# Patient Record
Sex: Female | Born: 1990 | Race: White | Hispanic: No | Marital: Single | Smoking: Former smoker
Health system: Southern US, Community
[De-identification: ages and names within clinical notes are randomized; demographics above are authoritative.]

## PROBLEM LIST (undated history)

## (undated) VITALS — BP 119/71 | HR 111 | Temp 98.6°F | Resp 20 | Ht 66.0 in

## (undated) DIAGNOSIS — T50901A Poisoning by unspecified drugs, medicaments and biological substances, accidental (unintentional), initial encounter: Secondary | ICD-10-CM

## (undated) DIAGNOSIS — F319 Bipolar disorder, unspecified: Secondary | ICD-10-CM

## (undated) DIAGNOSIS — F909 Attention-deficit hyperactivity disorder, unspecified type: Secondary | ICD-10-CM

## (undated) DIAGNOSIS — N83209 Unspecified ovarian cyst, unspecified side: Secondary | ICD-10-CM

## (undated) DIAGNOSIS — G8929 Other chronic pain: Secondary | ICD-10-CM

## (undated) DIAGNOSIS — E041 Nontoxic single thyroid nodule: Secondary | ICD-10-CM

## (undated) DIAGNOSIS — R87619 Unspecified abnormal cytological findings in specimens from cervix uteri: Secondary | ICD-10-CM

## (undated) DIAGNOSIS — F1910 Other psychoactive substance abuse, uncomplicated: Secondary | ICD-10-CM

## (undated) DIAGNOSIS — J45909 Unspecified asthma, uncomplicated: Secondary | ICD-10-CM

## (undated) DIAGNOSIS — M549 Dorsalgia, unspecified: Secondary | ICD-10-CM

## (undated) HISTORY — DX: Nontoxic single thyroid nodule: E04.1

## (undated) HISTORY — DX: Unspecified ovarian cyst, unspecified side: N83.209

## (undated) HISTORY — PX: MOUTH SURGERY: SHX715

## (undated) HISTORY — PX: HAND SURGERY: SHX662

## (undated) HISTORY — DX: Unspecified abnormal cytological findings in specimens from cervix uteri: R87.619

---
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
## 2008-02-11 ENCOUNTER — Other Ambulatory Visit: Admission: RE | Admit: 2008-02-11 | Discharge: 2008-02-11 | Payer: Self-pay | Admitting: Obstetrics and Gynecology

## 2008-05-02 ENCOUNTER — Emergency Department (HOSPITAL_COMMUNITY): Admission: EM | Admit: 2008-05-02 | Discharge: 2008-05-02 | Payer: Self-pay | Admitting: Emergency Medicine

## 2008-07-30 ENCOUNTER — Emergency Department (HOSPITAL_COMMUNITY): Admission: EM | Admit: 2008-07-30 | Discharge: 2008-07-30 | Payer: Self-pay | Admitting: Emergency Medicine

## 2008-11-13 ENCOUNTER — Emergency Department (HOSPITAL_COMMUNITY): Admission: EM | Admit: 2008-11-13 | Discharge: 2008-11-13 | Payer: Self-pay | Admitting: Emergency Medicine

## 2009-04-08 ENCOUNTER — Emergency Department (HOSPITAL_COMMUNITY): Admission: EM | Admit: 2009-04-08 | Discharge: 2009-04-08 | Payer: Self-pay | Admitting: Emergency Medicine

## 2009-06-26 ENCOUNTER — Emergency Department (HOSPITAL_COMMUNITY): Admission: EM | Admit: 2009-06-26 | Discharge: 2009-06-26 | Payer: Self-pay | Admitting: Emergency Medicine

## 2009-07-19 ENCOUNTER — Emergency Department (HOSPITAL_COMMUNITY): Admission: EM | Admit: 2009-07-19 | Discharge: 2009-07-19 | Payer: Self-pay | Admitting: Emergency Medicine

## 2009-07-23 ENCOUNTER — Emergency Department (HOSPITAL_COMMUNITY): Admission: EM | Admit: 2009-07-23 | Discharge: 2009-07-23 | Payer: Self-pay | Admitting: Emergency Medicine

## 2010-01-14 ENCOUNTER — Emergency Department (HOSPITAL_COMMUNITY): Admission: EM | Admit: 2010-01-14 | Discharge: 2010-01-14 | Payer: Self-pay | Admitting: Emergency Medicine

## 2010-02-01 ENCOUNTER — Emergency Department (HOSPITAL_COMMUNITY): Admission: EM | Admit: 2010-02-01 | Discharge: 2010-02-02 | Payer: Self-pay | Admitting: Emergency Medicine

## 2010-04-19 ENCOUNTER — Emergency Department (HOSPITAL_COMMUNITY): Admission: EM | Admit: 2010-04-19 | Discharge: 2010-04-20 | Payer: Self-pay | Admitting: Emergency Medicine

## 2010-05-27 ENCOUNTER — Emergency Department (HOSPITAL_COMMUNITY): Admission: EM | Admit: 2010-05-27 | Discharge: 2010-05-27 | Payer: Self-pay | Admitting: Emergency Medicine

## 2010-05-29 ENCOUNTER — Emergency Department (HOSPITAL_COMMUNITY): Admission: EM | Admit: 2010-05-29 | Discharge: 2010-05-30 | Payer: Self-pay | Admitting: Emergency Medicine

## 2010-05-30 ENCOUNTER — Emergency Department (HOSPITAL_COMMUNITY): Admission: EM | Admit: 2010-05-30 | Discharge: 2010-05-30 | Payer: Self-pay | Admitting: Emergency Medicine

## 2010-06-01 ENCOUNTER — Ambulatory Visit (HOSPITAL_COMMUNITY): Admission: RE | Admit: 2010-06-01 | Discharge: 2010-06-01 | Payer: Self-pay | Admitting: Orthopedic Surgery

## 2010-11-23 ENCOUNTER — Emergency Department (HOSPITAL_COMMUNITY)
Admission: EM | Admit: 2010-11-23 | Discharge: 2010-11-23 | Disposition: A | Discharge status: Home / Self Care | Payer: Self-pay | Attending: Emergency Medicine | Admitting: Emergency Medicine

## 2010-11-23 ENCOUNTER — Emergency Department (HOSPITAL_COMMUNITY): Payer: Self-pay

## 2010-11-23 DIAGNOSIS — Z008 Encounter for other general examination: Secondary | ICD-10-CM | POA: Insufficient documentation

## 2010-11-23 DIAGNOSIS — Z1810 Retained metal fragments, unspecified: Secondary | ICD-10-CM | POA: Insufficient documentation

## 2010-11-23 DIAGNOSIS — M795 Residual foreign body in soft tissue: Secondary | ICD-10-CM | POA: Insufficient documentation

## 2010-11-23 DIAGNOSIS — F1910 Other psychoactive substance abuse, uncomplicated: Secondary | ICD-10-CM | POA: Insufficient documentation

## 2010-12-07 LAB — COMPREHENSIVE METABOLIC PANEL
ALT: 17 U/L (ref 0–35)
AST: 22 U/L (ref 0–37)
Albumin: 3.8 g/dL (ref 3.5–5.2)
Alkaline Phosphatase: 84 U/L (ref 39–117)
BUN: 9 mg/dL (ref 6–23)
CO2: 28 mEq/L (ref 19–32)
Calcium: 9 mg/dL (ref 8.4–10.5)
Chloride: 105 mEq/L (ref 96–112)
Creatinine, Ser: 0.69 mg/dL (ref 0.4–1.2)
GFR calc Af Amer: >60 mL/min (ref >60)
GFR calc non Af Amer: >60 mL/min (ref >60)
Glucose, Bld: 63 mg/dL — ABNORMAL LOW (ref 70–99)
Potassium: 4.3 mEq/L (ref 3.5–5.1)
Sodium: 138 mEq/L (ref 135–145)
Total Bilirubin: 0.3 mg/dL (ref 0.3–1.2)
Total Protein: 6.4 g/dL (ref 6.0–8.3)

## 2010-12-07 LAB — DIFFERENTIAL
Basophils Absolute: 0 10*3/uL (ref 0.0–0.1)
Basophils Relative: 1 % (ref 0–1)
Eosinophils Absolute: 0.4 10*3/uL (ref 0.0–0.7)
Eosinophils Relative: 6 % — ABNORMAL HIGH (ref 0–5)
Lymphocytes Relative: 34 % (ref 12–46)
Lymphs Abs: 2.3 10*3/uL (ref 0.7–4.0)
Monocytes Absolute: 0.6 10*3/uL (ref 0.1–1.0)
Monocytes Relative: 9 % (ref 3–12)
Neutro Abs: 3.3 10*3/uL (ref 1.7–7.7)
Neutrophils Relative %: 50 % (ref 43–77)

## 2010-12-07 LAB — CBC
HCT: 36.3 % (ref 36.0–46.0)
Hemoglobin: 11.7 g/dL — ABNORMAL LOW (ref 12.0–15.0)
MCH: 28.1 pg (ref 26.0–34.0)
MCHC: 32.2 g/dL (ref 30.0–36.0)
MCV: 87.3 fL (ref 78.0–100.0)
Platelets: 275 10*3/uL (ref 150–400)
RBC: 4.16 MIL/uL (ref 3.87–5.11)
RDW: 16.7 % — ABNORMAL HIGH (ref 11.5–15.5)
WBC: 6.6 10*3/uL (ref 4.0–10.5)

## 2010-12-07 LAB — SURGICAL PCR SCREEN
MRSA, PCR: NEGATIVE
Staphylococcus aureus: NEGATIVE

## 2010-12-09 LAB — URINALYSIS, ROUTINE W REFLEX MICROSCOPIC
Glucose, UA: NEGATIVE mg/dL
Hgb urine dipstick: NEGATIVE
Nitrite: NEGATIVE
Protein, ur: NEGATIVE mg/dL
Specific Gravity, Urine: >1.03 — ABNORMAL HIGH (ref 1.005–1.030)
Urobilinogen, UA: 0.2 mg/dL (ref 0.0–1.0)
pH: 5 (ref 5.0–8.0)

## 2010-12-09 LAB — HCG, QUANTITATIVE, PREGNANCY: hCG, Beta Chain, Quant, S: 2 m[IU]/mL (ref <5)

## 2010-12-09 LAB — POCT PREGNANCY, URINE: Preg Test, Ur: NEGATIVE

## 2010-12-12 LAB — RAPID URINE DRUG SCREEN, HOSP PERFORMED
Amphetamines: NOT DETECTED
Barbiturates: NOT DETECTED
Barbiturates: NOT DETECTED
Benzodiazepines: POSITIVE — AB
Cocaine: NOT DETECTED
Opiates: POSITIVE — AB
Opiates: POSITIVE — AB
Tetrahydrocannabinol: POSITIVE — AB

## 2010-12-12 LAB — BASIC METABOLIC PANEL
BUN: 7 mg/dL (ref 6–23)
Calcium: 9.5 mg/dL (ref 8.4–10.5)
Chloride: 103 mEq/L (ref 96–112)
Creatinine, Ser: 0.69 mg/dL (ref 0.4–1.2)
GFR calc Af Amer: >60 mL/min (ref >60)

## 2010-12-12 LAB — CBC
HCT: 36.7 % (ref 36.0–46.0)
Hemoglobin: 12.5 g/dL (ref 12.0–15.0)
MCHC: 33.6 g/dL (ref 30.0–36.0)
MCV: 82.3 fL (ref 78.0–100.0)
MCV: 82.9 fL (ref 78.0–100.0)
Platelets: 340 10*3/uL (ref 150–400)
RBC: 4.45 MIL/uL (ref 3.87–5.11)
WBC: 11.2 10*3/uL — ABNORMAL HIGH (ref 4.0–10.5)
WBC: 13.1 10*3/uL — ABNORMAL HIGH (ref 4.0–10.5)

## 2010-12-12 LAB — COMPREHENSIVE METABOLIC PANEL
Alkaline Phosphatase: 98 U/L (ref 39–117)
BUN: 8 mg/dL (ref 6–23)
CO2: 25 mEq/L (ref 19–32)
Chloride: 106 mEq/L (ref 96–112)
Creatinine, Ser: 0.82 mg/dL (ref 0.4–1.2)
GFR calc non Af Amer: >60 mL/min (ref >60)
Glucose, Bld: 86 mg/dL (ref 70–99)
Potassium: 3.9 mEq/L (ref 3.5–5.1)
Total Bilirubin: 0.3 mg/dL (ref 0.3–1.2)

## 2010-12-12 LAB — URINALYSIS, ROUTINE W REFLEX MICROSCOPIC
Glucose, UA: NEGATIVE mg/dL
Ketones, ur: NEGATIVE mg/dL
Leukocytes, UA: NEGATIVE
Nitrite: NEGATIVE
Specific Gravity, Urine: >1.03 — ABNORMAL HIGH (ref 1.005–1.030)
Urobilinogen, UA: 1 mg/dL (ref 0.0–1.0)
pH: 5.5 (ref 5.0–8.0)

## 2010-12-12 LAB — POCT PREGNANCY, URINE: Preg Test, Ur: NEGATIVE

## 2010-12-12 LAB — DIFFERENTIAL
Basophils Absolute: 0 10*3/uL (ref 0.0–0.1)
Basophils Relative: 2 % — ABNORMAL HIGH (ref 0–1)
Basophils Relative: 0 % (ref 0–1)
Eosinophils Absolute: 0.1 10*3/uL (ref 0.0–0.7)
Lymphs Abs: 2.4 10*3/uL (ref 0.7–4.0)
Monocytes Absolute: 0.4 10*3/uL (ref 0.1–1.0)
Monocytes Relative: 6 % (ref 3–12)
Neutro Abs: 7.9 10*3/uL — ABNORMAL HIGH (ref 1.7–7.7)
Neutro Abs: 10.7 10*3/uL — ABNORMAL HIGH (ref 1.7–7.7)
Neutrophils Relative %: 71 % (ref 43–77)
Neutrophils Relative %: 81 % — ABNORMAL HIGH (ref 43–77)

## 2010-12-12 LAB — SALICYLATE LEVEL: Salicylate Lvl: <4 mg/dL (ref 2.8–20.0)

## 2010-12-12 LAB — PREGNANCY, URINE
Preg Test, Ur: NEGATIVE
Preg Test, Ur: NEGATIVE

## 2010-12-12 LAB — ETHANOL
Alcohol, Ethyl (B): <5 mg/dL (ref 0–10)
Alcohol, Ethyl (B): <5 mg/dL (ref 0–10)

## 2010-12-12 LAB — URINE MICROSCOPIC-ADD ON

## 2010-12-28 LAB — RAPID URINE DRUG SCREEN, HOSP PERFORMED
Amphetamines: NOT DETECTED
Barbiturates: NOT DETECTED

## 2010-12-28 LAB — COMPREHENSIVE METABOLIC PANEL
ALT: 17 U/L (ref 0–35)
AST: 22 U/L (ref 0–37)
CO2: 28 mEq/L (ref 19–32)
Chloride: 101 mEq/L (ref 96–112)
Creatinine, Ser: 0.73 mg/dL (ref 0.4–1.2)
GFR calc Af Amer: >60 mL/min (ref >60)
GFR calc non Af Amer: >60 mL/min (ref >60)
Total Bilirubin: 0.4 mg/dL (ref 0.3–1.2)

## 2010-12-28 LAB — DIFFERENTIAL
Basophils Absolute: 0 10*3/uL (ref 0.0–0.1)
Eosinophils Absolute: 0.2 10*3/uL (ref 0.0–0.7)
Eosinophils Relative: 1 % (ref 0–5)

## 2010-12-28 LAB — PREGNANCY, URINE: Preg Test, Ur: NEGATIVE

## 2010-12-28 LAB — CBC
Hemoglobin: 13.1 g/dL (ref 12.0–15.0)
MCV: 87.7 fL (ref 78.0–100.0)
RBC: 4.47 MIL/uL (ref 3.87–5.11)
WBC: 21.7 10*3/uL — ABNORMAL HIGH (ref 4.0–10.5)

## 2010-12-31 LAB — DIFFERENTIAL
Lymphocytes Relative: 36 % (ref 12–46)
Lymphs Abs: 3.9 10*3/uL (ref 0.7–4.0)
Neutrophils Relative %: 56 % (ref 43–77)

## 2010-12-31 LAB — URINALYSIS, ROUTINE W REFLEX MICROSCOPIC
Ketones, ur: NEGATIVE mg/dL
Nitrite: NEGATIVE
Specific Gravity, Urine: >1.03 — ABNORMAL HIGH (ref 1.005–1.030)
pH: 5.5 (ref 5.0–8.0)

## 2010-12-31 LAB — PREGNANCY, URINE: Preg Test, Ur: NEGATIVE

## 2010-12-31 LAB — WET PREP, GENITAL
Clue Cells Wet Prep HPF POC: NONE SEEN
Trich, Wet Prep: NONE SEEN

## 2010-12-31 LAB — CBC
Platelets: 237 10*3/uL (ref 150–400)
WBC: 10.9 10*3/uL — ABNORMAL HIGH (ref 4.0–10.5)

## 2010-12-31 LAB — RPR: RPR Ser Ql: NONREACTIVE

## 2011-05-08 ENCOUNTER — Encounter: Payer: Self-pay | Admitting: Emergency Medicine

## 2011-05-08 ENCOUNTER — Emergency Department (HOSPITAL_COMMUNITY)
Admission: EM | Admit: 2011-05-08 | Discharge: 2011-05-08 | Disposition: A | Discharge status: Home / Self Care | Payer: Self-pay | Attending: Emergency Medicine | Admitting: Emergency Medicine

## 2011-05-08 DIAGNOSIS — IMO0002 Reserved for concepts with insufficient information to code with codable children: Secondary | ICD-10-CM | POA: Insufficient documentation

## 2011-05-08 DIAGNOSIS — F17200 Nicotine dependence, unspecified, uncomplicated: Secondary | ICD-10-CM | POA: Insufficient documentation

## 2011-05-08 DIAGNOSIS — S20229A Contusion of unspecified back wall of thorax, initial encounter: Secondary | ICD-10-CM | POA: Insufficient documentation

## 2011-05-08 MED ORDER — IBUPROFEN 800 MG PO TABS: 800.0000 mg | ORAL_TABLET | Freq: Three times a day (TID) | ORAL | Status: AC
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
## 2011-05-16 ENCOUNTER — Emergency Department (HOSPITAL_COMMUNITY): Payer: Self-pay

## 2011-05-16 ENCOUNTER — Encounter (HOSPITAL_COMMUNITY): Payer: Self-pay

## 2011-05-16 ENCOUNTER — Emergency Department (HOSPITAL_COMMUNITY)
Admission: EM | Admit: 2011-05-16 | Discharge: 2011-05-16 | Disposition: A | Discharge status: Home / Self Care | Payer: Self-pay | Attending: Emergency Medicine | Admitting: Emergency Medicine

## 2011-05-16 DIAGNOSIS — S0990XA Unspecified injury of head, initial encounter: Secondary | ICD-10-CM | POA: Insufficient documentation

## 2011-05-16 DIAGNOSIS — IMO0002 Reserved for concepts with insufficient information to code with codable children: Secondary | ICD-10-CM | POA: Insufficient documentation

## 2011-05-16 DIAGNOSIS — S0180XA Unspecified open wound of other part of head, initial encounter: Secondary | ICD-10-CM | POA: Insufficient documentation

## 2011-05-16 DIAGNOSIS — S0181XA Laceration without foreign body of other part of head, initial encounter: Secondary | ICD-10-CM

## 2011-05-16 DIAGNOSIS — T7411XA Adult physical abuse, confirmed, initial encounter: Secondary | ICD-10-CM | POA: Insufficient documentation

## 2011-05-16 DIAGNOSIS — T07XXXA Unspecified multiple injuries, initial encounter: Secondary | ICD-10-CM

## 2011-05-16 DIAGNOSIS — S0003XA Contusion of scalp, initial encounter: Secondary | ICD-10-CM | POA: Insufficient documentation

## 2011-05-16 DIAGNOSIS — F17200 Nicotine dependence, unspecified, uncomplicated: Secondary | ICD-10-CM | POA: Insufficient documentation

## 2011-05-16 MED ORDER — HYDROMORPHONE HCL 2 MG/ML IJ SOLN
2.0000 mg | Freq: Once | INTRAMUSCULAR | Status: AC
  Administered 2011-05-16: 2 mg via INTRAMUSCULAR
  Filled 2011-05-16: qty 1

## 2011-05-16 MED ORDER — OXYCODONE-ACETAMINOPHEN 5-325 MG PO TABS: 2.0000 | ORAL_TABLET | ORAL | Status: AC | PRN

## 2011-05-16 MED ORDER — TETANUS-DIPHTHERIA TOXOIDS TD 5-2 LFU IM INJ
0.5000 mL | INJECTION | Freq: Once | INTRAMUSCULAR | Status: AC
  Administered 2011-05-16: 0.5 mL via INTRAMUSCULAR
  Filled 2011-05-16: qty 0.5

## 2011-05-16 MED ORDER — IBUPROFEN 800 MG PO TABS: 800.0000 mg | ORAL_TABLET | Freq: Three times a day (TID) | ORAL | Status: AC | PRN

## 2011-05-16 MED ORDER — LIDOCAINE-EPINEPHRINE (PF) 2 %-1:200000 IJ SOLN
INTRAMUSCULAR | Status: AC
  Administered 2011-05-16: 20 mL
  Filled 2011-05-16: qty 20

## 2011-05-16 MED ORDER — LIDOCAINE-EPINEPHRINE 2 %-1:100000 IJ SOLN: 20.0000 mL | Freq: Once | INTRAMUSCULAR | Status: DC
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
## 2011-05-21 ENCOUNTER — Encounter (HOSPITAL_COMMUNITY): Payer: Self-pay | Admitting: Emergency Medicine

## 2011-05-21 ENCOUNTER — Emergency Department (HOSPITAL_COMMUNITY)
Admission: EM | Admit: 2011-05-21 | Discharge: 2011-05-21 | Disposition: A | Discharge status: Home / Self Care | Payer: Self-pay | Attending: Emergency Medicine | Admitting: Emergency Medicine

## 2011-05-21 DIAGNOSIS — Z4802 Encounter for removal of sutures: Secondary | ICD-10-CM | POA: Insufficient documentation

## 2011-05-21 MED ORDER — OXYCODONE-ACETAMINOPHEN 5-325 MG PO TABS: 1.0000 | ORAL_TABLET | ORAL | Status: AC | PRN

## 2011-05-21 MED ORDER — OXYCODONE-ACETAMINOPHEN 5-325 MG PO TABS
ORAL_TABLET | ORAL | Status: AC
  Filled 2011-05-21: qty 1

## 2011-05-21 MED ORDER — OXYCODONE-ACETAMINOPHEN 5-325 MG PO TABS
1.0000 | ORAL_TABLET | Freq: Once | ORAL | Status: AC
  Administered 2011-05-21: 1 via ORAL

## 2011-05-21 NOTE — ED Provider Notes (Signed)
History     CSN: [id number] Arrival date & time: 05/21/2011 12:00 PM  Chief Complaint  Patient presents with  . Suture / Staple Removal   Patient is a 20 y.o. female presenting with suture removal. The history is provided by the patient.  Suture / Staple Removal  The sutures were placed 3 to 6 days ago. There has been no treatment since the wound repair. Fever duration: no fever. There has been no drainage from the wound. There is no redness present. The swelling has improved. The pain has improved. She has no difficulty moving the affected extremity or digit.    History reviewed. No pertinent past medical history.  History reviewed. No pertinent past surgical history.  History reviewed. No pertinent family history.  History  Substance Use Topics  . Smoking status: Current Everyday Smoker -- 1.0 packs/day  . Smokeless tobacco: Not on file  . Alcohol Use: 1.8 oz/week    3 Cans of beer per week    OB History    Grav Para Term Preterm Abortions TAB SAB Ect Mult Living                  Review of Systems  Constitutional: Negative for fever, chills, activity change and appetite change.  HENT: Negative for neck pain and neck stiffness.   Eyes: Negative for pain and visual disturbance.  Musculoskeletal: Negative.   Skin: Positive for wound.  Neurological: Negative for dizziness, facial asymmetry, weakness and headaches.  Hematological: Negative for adenopathy. Does not bruise/bleed easily.  All other systems reviewed and are negative.    Physical Exam  BP 137/86  Pulse 91  Temp(Src) 98.7 F (37.1 C) (Oral)  Resp 18  Ht 5\' 6"  (1.676 m)  Wt 160 lb (72.576 kg)  BMI 25.82 kg/m2  SpO2 100%  LMP 05/16/2011  Physical Exam  Nursing note and vitals reviewed. Constitutional: She is oriented to person, place, and time. She appears well-developed and well-nourished. No distress.  HENT:  Head: Normocephalic.    Right Ear: External ear normal.  Left Ear: External ear  normal.  Mouth/Throat: Oropharynx is clear and moist.  Eyes: EOM are normal. Pupils are equal, round, and reactive to light.  Neck: Normal range of motion. Neck supple.  Cardiovascular: Normal rate, regular rhythm and normal heart sounds.   Pulmonary/Chest: Effort normal and breath sounds normal.  Musculoskeletal: She exhibits tenderness. She exhibits no edema.  Lymphadenopathy:    She has no cervical adenopathy.  Neurological: She is alert and oriented to person, place, and time. She has normal reflexes. No cranial nerve deficit. She exhibits normal muscle tone.  Skin: Skin is warm. No rash noted. No erythema.    ED Course  Procedures  MDM   Laceration to left eyebrow with sutures intact.  Appears to be healing well.  Moderate bruising to the left periorbital region and left jaw, appears sub-acute. EOM's intact.  Sutures were removed w/o difficulty by the nursing staff.    I have reviewed the previous imagining and ED chart.    The patient appears reasonably screened and/or stabilized for discharge and I doubt any other medical condition or other [facility] requiring further screening, evaluation, or treatment in the ED at this time prior to discharge.       Lorrain Rivers L. Trisha Mangle, [place] 05/25/11 2012

## 2011-05-21 NOTE — ED Notes (Signed)
Pt here to have sutures removed from left upper eye. Pt c/o headache.

## 2011-05-21 NOTE — ED Notes (Signed)
Sutures removed per Pa order. Pt tolerated well.

## 2011-06-01 NOTE — ED Provider Notes (Signed)
Medical screening examination/treatment/procedure(s) were performed by non-physician practitioner and as supervising physician I was immediately available for consultation/collaboration.   Shelda Jakes, MD 06/01/11 0500

## 2011-06-11 NOTE — ED Provider Notes (Signed)
Evaluation and management procedures were performed by the PA/NP under my supervision/collaboration.    Coulter Oldaker D Lorilynn Lehr, MD 06/11/11 1248 

## 2011-07-03 ENCOUNTER — Emergency Department (HOSPITAL_COMMUNITY)
Admission: EM | Admit: 2011-07-03 | Discharge: 2011-07-03 | Discharge status: Against Medical Advice | Payer: Self-pay | Attending: Emergency Medicine | Admitting: Emergency Medicine

## 2011-07-03 DIAGNOSIS — Z5320 Procedure and treatment not carried out because of patient's decision for unspecified reasons: Secondary | ICD-10-CM | POA: Insufficient documentation

## 2011-07-06 LAB — GC/CHLAMYDIA PROBE AMP, GENITAL
Chlamydia, DNA Probe: NEGATIVE
GC Probe Amp, Genital: NEGATIVE

## 2011-07-06 LAB — WET PREP, GENITAL

## 2011-07-06 LAB — ABO/RH: ABO/RH(D): O POS

## 2011-07-23 ENCOUNTER — Encounter (HOSPITAL_COMMUNITY): Payer: Self-pay | Admitting: *Deleted

## 2011-07-23 DIAGNOSIS — R109 Unspecified abdominal pain: Secondary | ICD-10-CM | POA: Insufficient documentation

## 2011-07-23 DIAGNOSIS — Z202 Contact with and (suspected) exposure to infections with a predominantly sexual mode of transmission: Secondary | ICD-10-CM | POA: Insufficient documentation

## 2011-07-23 DIAGNOSIS — M25529 Pain in unspecified elbow: Secondary | ICD-10-CM | POA: Insufficient documentation

## 2011-07-23 DIAGNOSIS — W19XXXA Unspecified fall, initial encounter: Secondary | ICD-10-CM | POA: Insufficient documentation

## 2011-07-23 DIAGNOSIS — S5000XA Contusion of unspecified elbow, initial encounter: Secondary | ICD-10-CM | POA: Insufficient documentation
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
## 2011-07-24 ENCOUNTER — Emergency Department (HOSPITAL_COMMUNITY): Payer: Self-pay

## 2011-07-24 ENCOUNTER — Emergency Department (HOSPITAL_COMMUNITY)
Admission: EM | Admit: 2011-07-24 | Discharge: 2011-07-24 | Disposition: A | Discharge status: Home / Self Care | Payer: Self-pay | Attending: Emergency Medicine | Admitting: Emergency Medicine

## 2011-07-24 DIAGNOSIS — S5001XA Contusion of right elbow, initial encounter: Secondary | ICD-10-CM

## 2011-07-24 DIAGNOSIS — Z202 Contact with and (suspected) exposure to infections with a predominantly sexual mode of transmission: Secondary | ICD-10-CM

## 2011-07-24 LAB — URINALYSIS, ROUTINE W REFLEX MICROSCOPIC
Nitrite: NEGATIVE
Specific Gravity, Urine: >1.03 — ABNORMAL HIGH (ref 1.005–1.030)
Urobilinogen, UA: 0.2 mg/dL (ref 0.0–1.0)
pH: 5.5 (ref 5.0–8.0)

## 2011-07-24 LAB — URINE MICROSCOPIC-ADD ON

## 2011-07-24 LAB — WET PREP, GENITAL
Trich, Wet Prep: NONE SEEN
Yeast Wet Prep HPF POC: NONE SEEN

## 2011-07-24 LAB — PREGNANCY, URINE: Preg Test, Ur: NEGATIVE

## 2011-07-24 MED ORDER — METRONIDAZOLE 500 MG PO TABS
ORAL_TABLET | ORAL | Status: AC
  Filled 2011-07-24: qty 2

## 2011-07-24 MED ORDER — AZITHROMYCIN 250 MG PO TABS
1000.0000 mg | ORAL_TABLET | Freq: Once | ORAL | Status: AC
  Administered 2011-07-24: 1000 mg via ORAL
  Filled 2011-07-24: qty 4

## 2011-07-24 MED ORDER — LIDOCAINE HCL (PF) 1 % IJ SOLN
INTRAMUSCULAR | Status: AC
  Administered 2011-07-24: 5 mL
  Filled 2011-07-24: qty 5

## 2011-07-24 MED ORDER — CEFTRIAXONE SODIUM 250 MG IJ SOLR
250.0000 mg | Freq: Once | INTRAMUSCULAR | Status: AC
  Administered 2011-07-24: 250 mg via INTRAMUSCULAR
  Filled 2011-07-24: qty 250

## 2011-07-24 MED ORDER — ACETAMINOPHEN 325 MG PO TABS
975.0000 mg | ORAL_TABLET | Freq: Once | ORAL | Status: AC
  Administered 2011-07-24: 975 mg via ORAL
  Filled 2011-07-24: qty 3
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
## 2011-07-26 NOTE — ED Notes (Signed)
+   chlamydia Patient treated with Zithromax and Rocephin- [employer] letter faxed.Call and notify patient.

## 2011-08-02 ENCOUNTER — Telehealth (HOSPITAL_COMMUNITY): Payer: Self-pay | Admitting: Emergency Medicine

## 2011-10-23 ENCOUNTER — Encounter (HOSPITAL_COMMUNITY): Payer: Self-pay | Admitting: *Deleted

## 2011-10-23 ENCOUNTER — Emergency Department (HOSPITAL_COMMUNITY)
Admission: EM | Admit: 2011-10-23 | Discharge: 2011-10-23 | Disposition: A | Discharge status: Home / Self Care | Payer: Self-pay | Attending: Emergency Medicine | Admitting: Emergency Medicine

## 2011-10-23 DIAGNOSIS — X500XXA Overexertion from strenuous movement or load, initial encounter: Secondary | ICD-10-CM | POA: Insufficient documentation

## 2011-10-23 DIAGNOSIS — F17200 Nicotine dependence, unspecified, uncomplicated: Secondary | ICD-10-CM | POA: Insufficient documentation

## 2011-10-23 DIAGNOSIS — M545 Low back pain, unspecified: Secondary | ICD-10-CM | POA: Insufficient documentation

## 2011-10-23 DIAGNOSIS — M549 Dorsalgia, unspecified: Secondary | ICD-10-CM

## 2011-10-23 MED ORDER — DIPHENHYDRAMINE HCL 25 MG PO CAPS
25.0000 mg | ORAL_CAPSULE | Freq: Once | ORAL | Status: AC
  Administered 2011-10-23: 25 mg via ORAL
  Filled 2011-10-23: qty 1

## 2011-10-23 MED ORDER — PREDNISONE 20 MG PO TABS
60.0000 mg | ORAL_TABLET | Freq: Once | ORAL | Status: AC
  Administered 2011-10-23: 60 mg via ORAL
  Filled 2011-10-23: qty 3

## 2011-10-23 MED ORDER — METHOCARBAMOL 500 MG PO TABS
1000.0000 mg | ORAL_TABLET | Freq: Once | ORAL | Status: AC
  Administered 2011-10-23: 1000 mg via ORAL
  Filled 2011-10-23: qty 2

## 2011-10-23 MED ORDER — OXYCODONE-ACETAMINOPHEN 5-325 MG PO TABS: 1.0000 | ORAL_TABLET | Freq: Four times a day (QID) | ORAL | Status: AC | PRN

## 2011-10-23 MED ORDER — METHOCARBAMOL 500 MG PO TABS: ORAL_TABLET | ORAL | Status: DC

## 2011-10-23 MED ORDER — OXYCODONE-ACETAMINOPHEN 5-325 MG PO TABS
1.0000 | ORAL_TABLET | Freq: Once | ORAL | Status: AC
  Administered 2011-10-23: 1 via ORAL
  Filled 2011-10-23: qty 1

## 2011-10-23 NOTE — ED Notes (Signed)
Pt c/o lower back pain since lifting buckets of paint 1 1/2 hours ago. States that it feels like a bunch of bees stinging her.
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
## 2011-10-24 NOTE — ED Provider Notes (Signed)
Medical screening examination/treatment/procedure(s) were performed by non-physician practitioner and as supervising physician I was immediately available for consultation/collaboration.   Kenika Sahm, MD 10/24/11 2334 

## 2012-01-10 ENCOUNTER — Encounter (HOSPITAL_COMMUNITY): Payer: Self-pay | Admitting: *Deleted

## 2012-01-10 ENCOUNTER — Emergency Department (HOSPITAL_COMMUNITY): Payer: Self-pay

## 2012-01-10 ENCOUNTER — Emergency Department (HOSPITAL_COMMUNITY)
Admission: EM | Admit: 2012-01-10 | Discharge: 2012-01-11 | Disposition: A | Discharge status: Home / Self Care | Payer: Self-pay | Attending: Emergency Medicine | Admitting: Emergency Medicine

## 2012-01-10 DIAGNOSIS — S9031XA Contusion of right foot, initial encounter: Secondary | ICD-10-CM

## 2012-01-10 DIAGNOSIS — S9030XA Contusion of unspecified foot, initial encounter: Secondary | ICD-10-CM | POA: Insufficient documentation

## 2012-01-10 DIAGNOSIS — Y92009 Unspecified place in unspecified non-institutional (private) residence as the place of occurrence of the external cause: Secondary | ICD-10-CM | POA: Insufficient documentation

## 2012-01-10 DIAGNOSIS — M79609 Pain in unspecified limb: Secondary | ICD-10-CM | POA: Insufficient documentation

## 2012-01-10 DIAGNOSIS — F17200 Nicotine dependence, unspecified, uncomplicated: Secondary | ICD-10-CM | POA: Insufficient documentation

## 2012-01-10 DIAGNOSIS — R609 Edema, unspecified: Secondary | ICD-10-CM | POA: Insufficient documentation

## 2012-01-10 MED ORDER — IBUPROFEN 800 MG PO TABS
800.0000 mg | ORAL_TABLET | Freq: Once | ORAL | Status: AC
  Administered 2012-01-10: 800 mg via ORAL
  Filled 2012-01-10: qty 1

## 2012-01-10 MED ORDER — TRAMADOL HCL 50 MG PO TABS
100.0000 mg | ORAL_TABLET | Freq: Once | ORAL | Status: AC
  Administered 2012-01-10: 100 mg via ORAL
  Filled 2012-01-10: qty 2

## 2012-01-10 MED ORDER — TRAMADOL-ACETAMINOPHEN 37.5-325 MG PO TABS: ORAL_TABLET | ORAL | Status: AC

## 2012-01-10 MED ORDER — ACETAMINOPHEN 500 MG PO TABS
1000.0000 mg | ORAL_TABLET | Freq: Once | ORAL | Status: AC
  Administered 2012-01-10: 1000 mg via ORAL
  Filled 2012-01-10: qty 2
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
## 2012-01-10 NOTE — ED Notes (Signed)
Urine preg negative

## 2012-01-10 NOTE — Discharge Instructions (Signed)
Elevate your foot. Use ice packs to get the swelling down. Use the crutches until you are able to bear weight on your foot. Wear the post-op shoe for comfort. Take ibuprofen 600 mg 4 times a day for pain with the tramadol. You can have your foot rechecked by Dr Hilda Lias if not improving over the next week.     Contusion A contusion is a deep bruise. Contusions are the result of an injury that caused bleeding under the skin. The contusion may turn blue, purple, or yellow. Minor injuries will give you a painless contusion, but more severe contusions may stay painful and swollen for a few weeks.  CAUSES  A contusion is usually caused by a blow, trauma, or direct force to an area of the body. SYMPTOMS   Swelling and redness of the injured area.   Bruising of the injured area.   Tenderness and soreness of the injured area.   Pain.  DIAGNOSIS  The diagnosis can be made by taking a history and physical exam. An X-ray, CT scan, or MRI may be needed to determine if there were any associated injuries, such as fractures. TREATMENT  Specific treatment will depend on what area of the body was injured. In general, the best treatment for a contusion is resting, icing, elevating, and applying cold compresses to the injured area. Over-the-counter medicines may also be recommended for pain control. Ask your caregiver what the best treatment is for your contusion. HOME CARE INSTRUCTIONS   Put ice on the injured area.   Put ice in a plastic bag.   Place a towel between your skin and the bag.   Leave the ice on for 15 to 20 minutes, 3 to 4 times a day.   Only take over-the-counter or prescription medicines for pain, discomfort, or fever as directed by your caregiver. Your caregiver may recommend avoiding anti-inflammatory medicines (aspirin, ibuprofen, and naproxen) for 48 hours because these medicines may increase bruising.   Rest the injured area.   If possible, elevate the injured area to reduce  swelling.  SEEK IMMEDIATE MEDICAL CARE IF:   You have increased bruising or swelling.   You have pain that is getting worse.   Your swelling or pain is not relieved with medicines.  MAKE SURE YOU:   Understand these instructions.   Will watch your condition.   Will get help right away if you are not doing well or get worse.  Document Released: 06/20/2005 Document Revised: 08/30/2011 Document Reviewed: 07/16/2011 [facility] Patient Information 2012 [place], [place].

## 2012-01-10 NOTE — ED Notes (Signed)
Discharge instructions reviewed with pt, questions answered. Pt verbalized understanding.  
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
## 2012-03-09 ENCOUNTER — Emergency Department (HOSPITAL_COMMUNITY)
Admission: EM | Admit: 2012-03-09 | Discharge: 2012-03-09 | Disposition: A | Discharge status: Home / Self Care | Payer: Self-pay | Attending: Emergency Medicine | Admitting: Emergency Medicine

## 2012-03-09 ENCOUNTER — Encounter (HOSPITAL_COMMUNITY): Payer: Self-pay | Admitting: Emergency Medicine

## 2012-03-09 DIAGNOSIS — S335XXA Sprain of ligaments of lumbar spine, initial encounter: Secondary | ICD-10-CM | POA: Insufficient documentation

## 2012-03-09 DIAGNOSIS — X58XXXA Exposure to other specified factors, initial encounter: Secondary | ICD-10-CM | POA: Insufficient documentation

## 2012-03-09 DIAGNOSIS — S39012A Strain of muscle, fascia and tendon of lower back, initial encounter: Secondary | ICD-10-CM

## 2012-03-09 DIAGNOSIS — F17200 Nicotine dependence, unspecified, uncomplicated: Secondary | ICD-10-CM | POA: Insufficient documentation

## 2012-03-09 MED ORDER — IBUPROFEN 600 MG PO TABS: 600.0000 mg | ORAL_TABLET | Freq: Three times a day (TID) | ORAL | Status: AC | PRN

## 2012-03-09 MED ORDER — OXYCODONE-ACETAMINOPHEN 5-325 MG PO TABS: 1.0000 | ORAL_TABLET | ORAL | Status: AC | PRN

## 2012-03-09 MED ORDER — METHOCARBAMOL 500 MG PO TABS: 1000.0000 mg | ORAL_TABLET | Freq: Four times a day (QID) | ORAL | Status: DC

## 2012-03-09 MED ORDER — METHOCARBAMOL 500 MG PO TABS
1000.0000 mg | ORAL_TABLET | Freq: Once | ORAL | Status: AC
  Administered 2012-03-09: 1000 mg via ORAL

## 2012-03-09 MED ORDER — IBUPROFEN 800 MG PO TABS
800.0000 mg | ORAL_TABLET | Freq: Once | ORAL | Status: AC
  Administered 2012-03-09: 800 mg via ORAL
  Filled 2012-03-09: qty 1

## 2012-03-09 MED ORDER — METHOCARBAMOL 500 MG PO TABS: 1000.0000 mg | ORAL_TABLET | Freq: Four times a day (QID) | ORAL | Status: AC

## 2012-03-09 MED ORDER — IBUPROFEN 600 MG PO TABS: 600.0000 mg | ORAL_TABLET | Freq: Once | ORAL | Status: DC

## 2012-03-09 MED ORDER — OXYCODONE-ACETAMINOPHEN 5-325 MG PO TABS: 1.0000 | ORAL_TABLET | ORAL | Status: DC | PRN

## 2012-03-09 MED ORDER — OXYCODONE-ACETAMINOPHEN 5-325 MG PO TABS
1.0000 | ORAL_TABLET | Freq: Once | ORAL | Status: AC
  Administered 2012-03-09: 1 via ORAL
  Filled 2012-03-09: qty 1

## 2012-03-09 NOTE — ED Notes (Signed)
Patient with no complaints at this time. Respirations even and unlabored. Skin warm/dry. Discharge instructions reviewed with patient at this time. Patient given opportunity to voice concerns/ask questions. Patient discharged at this time and left Emergency Department with steady gait.   
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
## 2012-03-09 NOTE — ED Notes (Signed)
Patient is able to ambulate with steady gait.
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
## 2012-03-09 NOTE — Discharge Instructions (Signed)
Back Pain, Adult Low back pain is very common. About 1 in 5 people have back pain.The cause of low back pain is rarely dangerous. The pain often gets better over time.About half of people with a sudden onset of back pain feel better in just 2 weeks. About 8 in 10 people feel better by 6 weeks.  CAUSES Some common causes of back pain include:  Strain of the muscles or ligaments supporting the spine.   Wear and tear (degeneration) of the spinal discs.   Arthritis.   Direct injury to the back.  DIAGNOSIS Most of the time, the direct cause of low back pain is not known.However, back pain can be treated effectively even when the exact cause of the pain is unknown.Answering your caregiver's questions about your overall health and symptoms is one of the most accurate ways to make sure the cause of your pain is not dangerous. If your caregiver needs more information, he or she may order lab work or imaging tests (X-rays or MRIs).However, even if imaging tests show changes in your back, this usually does not require surgery. HOME CARE INSTRUCTIONS For many people, back pain returns.Since low back pain is rarely dangerous, it is often a condition that people can learn to manageon their own.   Remain active. It is stressful on the back to sit or stand in one place. Do not sit, drive, or stand in one place for more than 30 minutes at a time. Take short walks on level surfaces as soon as pain allows.Try to increase the length of time you walk each day.   Do not stay in bed.Resting more than 1 or 2 days can delay your recovery.   Do not avoid exercise or work.Your body is made to move.It is not dangerous to be active, even though your back may hurt.Your back will likely heal faster if you return to being active before your pain is gone.   Pay attention to your body when you bend and lift. Many people have less discomfortwhen lifting if they bend their knees, keep the load close to their  bodies,and avoid twisting. Often, the most comfortable positions are those that put less stress on your recovering back.   Find a comfortable position to sleep. Use a firm mattress and lie on your side with your knees slightly bent. If you lie on your back, put a pillow under your knees.   Only take over-the-counter or prescription medicines as directed by your caregiver. Over-the-counter medicines to reduce pain and inflammation are often the most helpful.Your caregiver may prescribe muscle relaxant drugs.These medicines help dull your pain so you can more quickly return to your normal activities and healthy exercise.   Put ice on the injured area.   Put ice in a plastic bag.   Place a towel between your skin and the bag.   Leave the ice on for 15 to 20 minutes, 3 to 4 times a day for the first 2 to 3 days. After that, ice and heat may be alternated to reduce pain and spasms.   Ask your caregiver about trying back exercises and gentle massage. This may be of some benefit.   Avoid feeling anxious or stressed.Stress increases muscle tension and can worsen back pain.It is important to recognize when you are anxious or stressed and learn ways to manage it.Exercise is a great option.  SEEK MEDICAL CARE IF:  You have pain that is not relieved with rest or medicine.   You have   pain that does not improve in 1 week.   You have new symptoms.   You are generally not feeling well.  SEEK IMMEDIATE MEDICAL CARE IF:   You have pain that radiates from your back into your legs.   You develop new bowel or bladder control problems.   You have unusual weakness or numbness in your arms or legs.   You develop nausea or vomiting.   You develop abdominal pain.   You feel faint.  Document Released: 09/10/2005 Document Revised: 08/30/2011 Document Reviewed: 01/29/2011 [employer] Patient Information 2012 [employer]. 

## 2012-03-10 NOTE — ED Provider Notes (Signed)
Medical screening examination/treatment/procedure(s) were performed by non-physician practitioner and as supervising physician I was immediately available for consultation/collaboration.   Laray Anger, DO 03/10/12 [phone]

## 2012-06-05 ENCOUNTER — Encounter (HOSPITAL_COMMUNITY): Payer: Self-pay | Admitting: Emergency Medicine

## 2012-06-05 ENCOUNTER — Emergency Department (HOSPITAL_COMMUNITY)
Admission: EM | Admit: 2012-06-05 | Discharge: 2012-06-05 | Discharge status: Against Medical Advice | Payer: Self-pay | Attending: Emergency Medicine | Admitting: Emergency Medicine

## 2012-06-05 DIAGNOSIS — M549 Dorsalgia, unspecified: Secondary | ICD-10-CM | POA: Insufficient documentation

## 2012-06-05 DIAGNOSIS — M79609 Pain in unspecified limb: Secondary | ICD-10-CM | POA: Insufficient documentation

## 2012-06-05 MED ORDER — SODIUM CHLORIDE 0.9 % IV SOLN: Freq: Once | INTRAVENOUS | Status: DC

## 2012-06-05 MED ORDER — SODIUM CHLORIDE 0.9 % IV SOLN: 1000.0000 mL | Freq: Once | INTRAVENOUS | Status: DC

## 2012-06-05 NOTE — ED Notes (Signed)
Patient walked out of her room and was stopped as she was exiting the ED.  Patient states she is leaving, going to smoke a cigarette, and wait on her cousin to come get her.  States she is not waiting -  May come back later.
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
## 2012-06-25 ENCOUNTER — Emergency Department (HOSPITAL_COMMUNITY)
Admission: EM | Admit: 2012-06-25 | Discharge: 2012-06-25 | Disposition: A | Discharge status: Home / Self Care | Payer: Self-pay | Attending: Emergency Medicine | Admitting: Emergency Medicine

## 2012-06-25 ENCOUNTER — Encounter (HOSPITAL_COMMUNITY): Payer: Self-pay | Admitting: *Deleted

## 2012-06-25 DIAGNOSIS — F17200 Nicotine dependence, unspecified, uncomplicated: Secondary | ICD-10-CM | POA: Insufficient documentation

## 2012-06-25 DIAGNOSIS — M5416 Radiculopathy, lumbar region: Secondary | ICD-10-CM

## 2012-06-25 DIAGNOSIS — R52 Pain, unspecified: Secondary | ICD-10-CM | POA: Insufficient documentation

## 2012-06-25 DIAGNOSIS — G8929 Other chronic pain: Secondary | ICD-10-CM | POA: Insufficient documentation

## 2012-06-25 DIAGNOSIS — IMO0002 Reserved for concepts with insufficient information to code with codable children: Secondary | ICD-10-CM | POA: Insufficient documentation

## 2012-06-25 HISTORY — DX: Other chronic pain: G89.29

## 2012-06-25 HISTORY — DX: Dorsalgia, unspecified: M54.9

## 2012-06-25 MED ORDER — CYCLOBENZAPRINE HCL 10 MG PO TABS
10.0000 mg | ORAL_TABLET | Freq: Once | ORAL | Status: AC
  Administered 2012-06-25: 10 mg via ORAL
  Filled 2012-06-25: qty 1

## 2012-06-25 MED ORDER — OXYCODONE-ACETAMINOPHEN 5-325 MG PO TABS
1.0000 | ORAL_TABLET | Freq: Once | ORAL | Status: AC
  Administered 2012-06-25: 1 via ORAL
  Filled 2012-06-25: qty 1

## 2012-06-25 MED ORDER — PREDNISONE 50 MG PO TABS: 50.0000 mg | ORAL_TABLET | Freq: Every day | ORAL | Status: DC

## 2012-06-25 MED ORDER — OXYCODONE-ACETAMINOPHEN 5-325 MG PO TABS: ORAL_TABLET | ORAL | Status: DC

## 2012-06-25 MED ORDER — PREDNISONE 20 MG PO TABS
60.0000 mg | ORAL_TABLET | Freq: Once | ORAL | Status: AC
  Administered 2012-06-25: 60 mg via ORAL
  Filled 2012-06-25: qty 3

## 2012-06-25 MED ORDER — CYCLOBENZAPRINE HCL 10 MG PO TABS: ORAL_TABLET | ORAL | Status: DC

## 2012-06-25 NOTE — ED Notes (Signed)
Chronic lower back pain with pain radiating down right leg. Fare up began last night.
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
## 2012-06-26 NOTE — ED Provider Notes (Signed)
Medical screening examination/treatment/procedure(s) were performed by non-physician practitioner and as supervising physician I was immediately available for consultation/collaboration.   Tomi Grandpre W. Haig Gerardo, MD 06/26/12 1601 

## 2012-07-26 ENCOUNTER — Encounter (HOSPITAL_COMMUNITY): Payer: Self-pay | Admitting: *Deleted

## 2012-07-26 ENCOUNTER — Emergency Department (HOSPITAL_COMMUNITY)
Admission: EM | Admit: 2012-07-26 | Discharge: 2012-07-26 | Disposition: A | Discharge status: Home / Self Care | Payer: Self-pay | Attending: Emergency Medicine | Admitting: Emergency Medicine

## 2012-07-26 DIAGNOSIS — G8929 Other chronic pain: Secondary | ICD-10-CM | POA: Insufficient documentation

## 2012-07-26 DIAGNOSIS — F17200 Nicotine dependence, unspecified, uncomplicated: Secondary | ICD-10-CM | POA: Insufficient documentation

## 2012-07-26 DIAGNOSIS — I10 Essential (primary) hypertension: Secondary | ICD-10-CM | POA: Insufficient documentation

## 2012-07-26 DIAGNOSIS — T7840XA Allergy, unspecified, initial encounter: Secondary | ICD-10-CM | POA: Insufficient documentation

## 2012-07-26 DIAGNOSIS — R21 Rash and other nonspecific skin eruption: Secondary | ICD-10-CM | POA: Insufficient documentation

## 2012-07-26 DIAGNOSIS — M549 Dorsalgia, unspecified: Secondary | ICD-10-CM | POA: Insufficient documentation

## 2012-07-26 MED ORDER — DIPHENHYDRAMINE HCL 50 MG/ML IJ SOLN
25.0000 mg | Freq: Once | INTRAMUSCULAR | Status: AC
  Administered 2012-07-26: 25 mg via INTRAVENOUS
  Filled 2012-07-26: qty 1

## 2012-07-26 MED ORDER — FAMOTIDINE IN NACL 20-0.9 MG/50ML-% IV SOLN
20.0000 mg | Freq: Once | INTRAVENOUS | Status: AC
  Administered 2012-07-26: 20 mg via INTRAVENOUS
  Filled 2012-07-26: qty 50

## 2012-07-26 MED ORDER — OXYCODONE-ACETAMINOPHEN 5-325 MG PO TABS: 1.0000 | ORAL_TABLET | Freq: Four times a day (QID) | ORAL | Status: DC | PRN

## 2012-07-26 MED ORDER — FENTANYL CITRATE 0.05 MG/ML IJ SOLN
100.0000 ug | Freq: Once | INTRAMUSCULAR | Status: AC
  Administered 2012-07-26: 100 ug via INTRAVENOUS
  Filled 2012-07-26: qty 2

## 2012-07-26 MED ORDER — PREDNISONE 20 MG PO TABS: 60.0000 mg | ORAL_TABLET | Freq: Every day | ORAL | Status: DC

## 2012-07-26 MED ORDER — METHYLPREDNISOLONE SODIUM SUCC 125 MG IJ SOLR
125.0000 mg | Freq: Once | INTRAMUSCULAR | Status: AC
  Administered 2012-07-26: 125 mg via INTRAVENOUS
  Filled 2012-07-26: qty 2

## 2012-07-26 NOTE — ED Provider Notes (Signed)
History   This chart was scribed for No att. providers found by Toya Smothers. The patient was seen in room APA01/APA01. Patient's care was started at 1825.  CSN: [id number]  Arrival date & time 07/26/12  1825   First MD Initiated Contact with Patient 07/26/12 1843      Chief Complaint  Patient presents with  . Allergic Reaction   Patient is a 21 y.o. female presenting with allergic reaction. The history is provided by the patient. No language interpreter was used.  Allergic Reaction The primary symptoms are  rash. The primary symptoms do not include wheezing, shortness of breath or cough.   Rhonda Good is a 21 y.o. female who presents to the Emergency Department complaining of 4 hours of a sudden onset, gradually worsening, moderate allergic reaction, with associate tremors, hives with itching and redness, and mild difficulty breathing. Pt reports taking what she believed to be Aleve for back pain, just prior to onset. Pill was described as blue with "I3" on one side. Pt also c/o mild chronic back pain. Symptoms have not been treated PTA. No fever, chills, cough, congestion, rhinorrhea, chest pain, or n/v/d. Medical Hx includes chronic back pain and Hydrocodone allergy. Pt is a current pack/day smoker and denies the consumption of alcohol.   Past Medical History  Diagnosis Date  . Chronic back pain     Past Surgical History  Procedure Date  . Mouth surgery   . Hand surgery     No family history on file.  History  Substance Use Topics  . Smoking status: Current Every Day Smoker -- 1.0 packs/day  . Smokeless tobacco: Not on file  . Alcohol Use: No   Review of Systems  Respiratory: Negative for cough, shortness of breath and wheezing.   Skin: Positive for rash.  All other systems reviewed and are negative.    Allergies  Hydrocodone  Home Medications   Current Outpatient Rx  Name Route Sig Dispense Refill  . IBUPROFEN 200 MG PO TABS Oral Take 800 mg by mouth as  needed. For pain    . OXYCODONE-ACETAMINOPHEN 5-325 MG PO TABS Oral Take 1-2 tablets by mouth every 6 (six) hours as needed for pain. 10 tablet 0  . PREDNISONE 20 MG PO TABS Oral Take 3 tablets (60 mg total) by mouth daily. 9 tablet 0    BP 148/102  Pulse 127  Temp 98.4 F (36.9 C)  Resp 24  Ht 5\' 6"  (1.676 m)  Wt 185 lb (83.915 kg)  BMI 29.86 kg/m2  SpO2 95%  LMP 07/04/2012  Physical Exam  Constitutional: She is oriented to person, place, and time. No distress.       She appears anxious.  HENT:  Head: Normocephalic and atraumatic.  Mouth/Throat: Oropharynx is clear and moist. No oropharyngeal exudate.       posterior pharynx appears normal.   Eyes: Conjunctivae normal are normal. Right eye exhibits no discharge. Left eye exhibits no discharge.  Neck: Normal range of motion. Neck supple. No JVD present. No tracheal deviation present.  Cardiovascular: Normal rate and regular rhythm.   No murmur heard. Pulmonary/Chest: Effort normal and breath sounds normal. No stridor. No respiratory distress. She has no wheezes.  Abdominal: Soft. Bowel sounds are normal. There is no tenderness.  Musculoskeletal: Normal range of motion. She exhibits no edema and no tenderness.  Lymphadenopathy:    She has no cervical adenopathy.  Neurological: She is alert and oriented to person, place, and time. Coordination  normal.  Skin: She is not diaphoretic.       Hives on back chest.     ED Course  Procedures COORDINATION OF CARE: 18:50- Evaluated Pt. Pt is awake, alert, and anxious. 18:56- Patientinformed of clinical course, understand medical decision-making process, and agree with plan. 20:00- Rechecked Pt. Pt is feeling better after treatment. Will continue to monitor.  No results found for this or any previous visit (from the past 24 hour(s)).    MDM  Patient possible allergic reaction and chronic back pain. She's been stable on she's been here. Patient be discharged home with  steroids.    I personally performed the services described in this documentation, which was scribed in my presence. The recorded information has been reviewed and considered.         Juliet Rude. Rubin Payor, MD 07/26/12 [phone]

## 2012-07-26 NOTE — ED Notes (Addendum)
Pt states that she took 2 tablets this afternoon, ? Naproxen, c/o shaking all over, upset, tearful, rash to arms and back area, feels like her throat and lips are swelling, sob, tingling to facial and hands, pt anxious in triage, comfort measures provided.   pt able to speak clear,

## 2012-08-10 ENCOUNTER — Emergency Department (HOSPITAL_COMMUNITY)
Admission: EM | Admit: 2012-08-10 | Discharge: 2012-08-10 | Disposition: A | Discharge status: Home / Self Care | Payer: Self-pay | Attending: Emergency Medicine | Admitting: Emergency Medicine

## 2012-08-10 ENCOUNTER — Encounter (HOSPITAL_COMMUNITY): Payer: Self-pay | Admitting: Emergency Medicine

## 2012-08-10 DIAGNOSIS — IMO0002 Reserved for concepts with insufficient information to code with codable children: Secondary | ICD-10-CM | POA: Insufficient documentation

## 2012-08-10 DIAGNOSIS — M5416 Radiculopathy, lumbar region: Secondary | ICD-10-CM

## 2012-08-10 DIAGNOSIS — F17200 Nicotine dependence, unspecified, uncomplicated: Secondary | ICD-10-CM | POA: Insufficient documentation

## 2012-08-10 MED ORDER — OXYCODONE-ACETAMINOPHEN 5-325 MG PO TABS: 1.0000 | ORAL_TABLET | ORAL | Status: AC | PRN

## 2012-08-10 MED ORDER — OXYCODONE-ACETAMINOPHEN 5-325 MG PO TABS
1.0000 | ORAL_TABLET | Freq: Once | ORAL | Status: AC
  Administered 2012-08-10: 1 via ORAL
  Filled 2012-08-10: qty 1

## 2012-08-10 MED ORDER — CYCLOBENZAPRINE HCL 10 MG PO TABS
10.0000 mg | ORAL_TABLET | Freq: Once | ORAL | Status: AC
  Administered 2012-08-10: 10 mg via ORAL
  Filled 2012-08-10: qty 1

## 2012-08-10 MED ORDER — CYCLOBENZAPRINE HCL 10 MG PO TABS: 10.0000 mg | ORAL_TABLET | Freq: Three times a day (TID) | ORAL | Status: DC | PRN
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
## 2012-08-10 NOTE — ED Provider Notes (Signed)
History     CSN: [id number]  Arrival date & time 08/10/12  1425   First MD Initiated Contact with Patient 08/10/12 1512      Chief Complaint  Patient presents with  . Back Pain    (Consider location/radiation/quality/duration/timing/severity/associated sxs/prior treatment) Patient is a 21 y.o. female presenting with back pain. The history is provided by the patient.  Back Pain  This is a new problem. The current episode started 2 days ago. The problem occurs constantly. The problem has not changed since onset.The pain is associated with twisting (bending over). The pain is present in the lumbar spine and sacro-iliac joint. The pain radiates to the right thigh and right knee. The pain is mild. The symptoms are aggravated by bending, twisting and certain positions. Associated symptoms include leg pain. Pertinent negatives include no chest pain, no fever, no numbness, no abdominal pain, no abdominal swelling, no bowel incontinence, no perianal numbness, no bladder incontinence, no dysuria, no pelvic pain, no paresthesias, no paresis, no tingling and no weakness. She has tried NSAIDs for the symptoms. The treatment provided no relief.    Past Medical History  Diagnosis Date  . Chronic back pain     Past Surgical History  Procedure Date  . Mouth surgery   . Hand surgery     No family history on file.  History  Substance Use Topics  . Smoking status: Current Every Day Smoker -- 1.0 packs/day  . Smokeless tobacco: Not on file  . Alcohol Use: No    OB History    Grav Para Term Preterm Abortions TAB SAB Ect Mult Living                  Review of Systems  Constitutional: Negative for fever.  Respiratory: Negative for shortness of breath.   Cardiovascular: Negative for chest pain.  Gastrointestinal: Negative for vomiting, abdominal pain, constipation and bowel incontinence.  Genitourinary: Negative for bladder incontinence, dysuria, hematuria, flank pain, decreased urine  volume, difficulty urinating and pelvic pain.       No perineal numbness or incontinence of urine or feces  Musculoskeletal: Positive for back pain. Negative for joint swelling.  Skin: Negative for rash.  Neurological: Negative for tingling, weakness, numbness and paresthesias.  All other systems reviewed and are negative.    Allergies  Hydrocodone and Naproxen  Home Medications   Current Outpatient Rx  Name  Route  Sig  Dispense  Refill  . IBUPROFEN 200 MG PO TABS   Oral   Take 800 mg by mouth as needed. For pain           BP 143/64  Pulse 101  Temp 98.4 F (36.9 C) (Oral)  Resp 17  Ht 5\' 6"  (1.676 m)  Wt 185 lb (83.915 kg)  BMI 29.86 kg/m2  SpO2 98%  LMP 08/08/2012  Physical Exam  Nursing note and vitals reviewed. Constitutional: She is oriented to person, place, and time. She appears well-developed and well-nourished. No distress.  HENT:  Head: Normocephalic and atraumatic.  Neck: Normal range of motion. Neck supple.  Cardiovascular: Normal rate, regular rhythm and intact distal pulses.   No murmur heard. Pulmonary/Chest: Effort normal and breath sounds normal.  Musculoskeletal: She exhibits tenderness. She exhibits no edema.       Lumbar back: She exhibits tenderness and pain. She exhibits normal range of motion, no swelling, no deformity, no laceration and normal pulse.       Back:  Neurological: She is alert and  oriented to person, place, and time. No cranial nerve deficit or sensory deficit. She exhibits normal muscle tone. Coordination and gait normal.  Reflex Scores:      Patellar reflexes are 2+ on the right side and 2+ on the left side.      Achilles reflexes are 2+ on the right side and 2+ on the left side. Skin: Skin is warm and dry.    ED Course  Procedures (including critical care time)  Labs Reviewed - No data to display No results found.   patient had MRI L spine from 2008 that showed:  Degenerative disc disease changes at T12-L1 and  L4-5. Mild annular bulging T12-L1 and minimal central disc protrusion at L4-5. No evidence for neural compromise. Remote L-1 compression fracture with anterior wedging is appreciated    MDM    Previous ED chart reviewed.    Patient has ttp of the lumbar spine, SI joint and right lumbar paraspinal muscles.  No focal neuro deficits on exam.  Ambulates with a steady gait.   Doubt emergent neurological process.  Pt has appt with Dr. Janna Arch on 08/18/12 per patient  Prescribed: Percocet #20 flexeril  Tishawn Friedhoff L. [place], [place] 08/12/12 2029

## 2012-08-10 NOTE — Discharge Instructions (Signed)

## 2012-08-10 NOTE — ED Notes (Signed)
Pt was given meds to take, acted as if swallowed both pills, rn noted percocet between pts fingers while signing for her paperwork, when asked why she did not take the pill she stated "i was", rn watched pt. Swallow the pill, she began chewing the pill with er teeth.  Pa notified of event.

## 2012-08-13 NOTE — ED Provider Notes (Signed)
Medical screening examination/treatment/procedure(s) were performed by non-physician practitioner and as supervising physician I was immediately available for consultation/collaboration.   Keltie Labell L Elizabella Nolet, MD 08/13/12 1243 

## 2012-08-20 ENCOUNTER — Encounter (HOSPITAL_COMMUNITY): Payer: Self-pay | Admitting: Emergency Medicine

## 2012-08-20 ENCOUNTER — Other Ambulatory Visit: Payer: Self-pay

## 2012-08-20 ENCOUNTER — Inpatient Hospital Stay (HOSPITAL_COMMUNITY)
Admission: EM | Admit: 2012-08-20 | Discharge: 2012-08-22 | DRG: 917 | Disposition: A | Discharge status: Other Acute Inpatient Hospital | Payer: MEDICAID | Attending: Internal Medicine | Admitting: Internal Medicine

## 2012-08-20 DIAGNOSIS — T43502A Poisoning by unspecified antipsychotics and neuroleptics, intentional self-harm, initial encounter: Secondary | ICD-10-CM | POA: Diagnosis present

## 2012-08-20 DIAGNOSIS — F17200 Nicotine dependence, unspecified, uncomplicated: Secondary | ICD-10-CM | POA: Diagnosis present

## 2012-08-20 DIAGNOSIS — F319 Bipolar disorder, unspecified: Secondary | ICD-10-CM | POA: Diagnosis present

## 2012-08-20 DIAGNOSIS — G8929 Other chronic pain: Secondary | ICD-10-CM | POA: Diagnosis present

## 2012-08-20 DIAGNOSIS — G9340 Encephalopathy, unspecified: Secondary | ICD-10-CM | POA: Diagnosis present

## 2012-08-20 DIAGNOSIS — Z23 Encounter for immunization: Secondary | ICD-10-CM

## 2012-08-20 DIAGNOSIS — T481X4A Poisoning by skeletal muscle relaxants [neuromuscular blocking agents], undetermined, initial encounter: Secondary | ICD-10-CM | POA: Diagnosis present

## 2012-08-20 DIAGNOSIS — T50901A Poisoning by unspecified drugs, medicaments and biological substances, accidental (unintentional), initial encounter: Secondary | ICD-10-CM | POA: Diagnosis present

## 2012-08-20 DIAGNOSIS — F10929 Alcohol use, unspecified with intoxication, unspecified: Secondary | ICD-10-CM | POA: Diagnosis present

## 2012-08-20 DIAGNOSIS — I959 Hypotension, unspecified: Secondary | ICD-10-CM | POA: Diagnosis present

## 2012-08-20 DIAGNOSIS — M549 Dorsalgia, unspecified: Secondary | ICD-10-CM | POA: Diagnosis present

## 2012-08-20 DIAGNOSIS — T424X4A Poisoning by benzodiazepines, undetermined, initial encounter: Principal | ICD-10-CM | POA: Diagnosis present

## 2012-08-20 DIAGNOSIS — F1010 Alcohol abuse, uncomplicated: Secondary | ICD-10-CM

## 2012-08-20 DIAGNOSIS — T50992A Poisoning by other drugs, medicaments and biological substances, intentional self-harm, initial encounter: Secondary | ICD-10-CM | POA: Diagnosis present

## 2012-08-20 DIAGNOSIS — T46904A Poisoning by unspecified agents primarily affecting the cardiovascular system, undetermined, initial encounter: Secondary | ICD-10-CM | POA: Diagnosis present

## 2012-08-20 LAB — ACETAMINOPHEN LEVEL
Acetaminophen (Tylenol), Serum: <15 ug/mL (ref 10–30)
Acetaminophen (Tylenol), Serum: <15 ug/mL (ref 10–30)

## 2012-08-20 LAB — COMPREHENSIVE METABOLIC PANEL
Alkaline Phosphatase: 83 U/L (ref 39–117)
BUN: 11 mg/dL (ref 6–23)
Calcium: 9.5 mg/dL (ref 8.4–10.5)
GFR calc Af Amer: >90 mL/min (ref >90)
GFR calc non Af Amer: >90 mL/min (ref >90)
Glucose, Bld: 92 mg/dL (ref 70–99)
Total Protein: 7.4 g/dL (ref 6.0–8.3)

## 2012-08-20 LAB — URINALYSIS, ROUTINE W REFLEX MICROSCOPIC
Glucose, UA: NEGATIVE mg/dL
Ketones, ur: NEGATIVE mg/dL
Leukocytes, UA: NEGATIVE
Specific Gravity, Urine: <1.005 — ABNORMAL LOW (ref 1.005–1.030)
pH: 6 (ref 5.0–8.0)

## 2012-08-20 LAB — CREATININE, SERUM
GFR calc Af Amer: >90 mL/min (ref >90)
GFR calc non Af Amer: >90 mL/min (ref >90)

## 2012-08-20 LAB — CBC
HCT: 41.4 % (ref 36.0–46.0)
HCT: 37.2 % (ref 36.0–46.0)
Hemoglobin: 13.6 g/dL (ref 12.0–15.0)
MCH: 29.1 pg (ref 26.0–34.0)
MCHC: 32.9 g/dL (ref 30.0–36.0)
MCHC: 32.5 g/dL (ref 30.0–36.0)
Platelets: 245 10*3/uL (ref 150–400)
RDW: 18 % — ABNORMAL HIGH (ref 11.5–15.5)
WBC: 4.7 10*3/uL (ref 4.0–10.5)

## 2012-08-20 LAB — RAPID URINE DRUG SCREEN, HOSP PERFORMED
Benzodiazepines: NOT DETECTED
Cocaine: NOT DETECTED

## 2012-08-20 LAB — SALICYLATE LEVEL: Salicylate Lvl: 0 mg/dL — ABNORMAL LOW (ref 2.8–20.0)

## 2012-08-20 LAB — ETHANOL: Alcohol, Ethyl (B): 201 mg/dL — ABNORMAL HIGH (ref 0–11)

## 2012-08-20 MED ORDER — NALOXONE HCL 1 MG/ML IJ SOLN
INTRAMUSCULAR | Status: AC
  Filled 2012-08-20: qty 4

## 2012-08-20 MED ORDER — ENOXAPARIN SODIUM 40 MG/0.4ML ~~LOC~~ SOLN
40.0000 mg | SUBCUTANEOUS | Status: DC
  Administered 2012-08-20 – 2012-08-22 (×3): 40 mg via SUBCUTANEOUS
  Filled 2012-08-20 (×3): qty 0.4

## 2012-08-20 MED ORDER — ONDANSETRON HCL 4 MG PO TABS: 4.0000 mg | ORAL_TABLET | Freq: Four times a day (QID) | ORAL | Status: DC | PRN

## 2012-08-20 MED ORDER — PNEUMOCOCCAL VAC POLYVALENT 25 MCG/0.5ML IJ INJ
0.5000 mL | INJECTION | Freq: Once | INTRAMUSCULAR | Status: AC
  Administered 2012-08-20: 0.5 mL via INTRAMUSCULAR
  Filled 2012-08-20: qty 0.5

## 2012-08-20 MED ORDER — ACETAMINOPHEN 650 MG RE SUPP: 650.0000 mg | Freq: Four times a day (QID) | RECTAL | Status: DC | PRN

## 2012-08-20 MED ORDER — NALOXONE HCL 1 MG/ML IJ SOLN: 2.0000 mg | Freq: Once | INTRAMUSCULAR | Status: DC

## 2012-08-20 MED ORDER — NALOXONE HCL 0.4 MG/ML IJ SOLN
0.4000 mg | INTRAMUSCULAR | Status: DC | PRN
  Administered 2012-08-20: 0.4 mg via INTRAVENOUS
  Administered 2012-08-20: 1.2 mg via INTRAVENOUS
  Filled 2012-08-20: qty 1
  Filled 2012-08-20: qty 3

## 2012-08-20 MED ORDER — SODIUM CHLORIDE 0.9 % IV SOLN
Freq: Once | INTRAVENOUS | Status: AC
  Administered 2012-08-20: 01:00:00 via INTRAVENOUS

## 2012-08-20 MED ORDER — INFLUENZA VIRUS VACC SPLIT PF IM SUSP
0.5000 mL | Freq: Once | INTRAMUSCULAR | Status: AC
  Administered 2012-08-20: 0.5 mL via INTRAMUSCULAR
  Filled 2012-08-20: qty 0.5

## 2012-08-20 MED ORDER — ACETAMINOPHEN 325 MG PO TABS
650.0000 mg | ORAL_TABLET | Freq: Four times a day (QID) | ORAL | Status: DC | PRN
  Administered 2012-08-20 – 2012-08-22 (×3): 650 mg via ORAL
  Filled 2012-08-20 (×3): qty 2

## 2012-08-20 MED ORDER — NALOXONE HCL 1 MG/ML IJ SOLN: 2.0000 mg | INTRAMUSCULAR | Status: DC | PRN

## 2012-08-20 MED ORDER — POTASSIUM CHLORIDE IN NACL 20-0.9 MEQ/L-% IV SOLN
INTRAVENOUS | Status: DC
  Administered 2012-08-20 (×2): via INTRAVENOUS
  Administered 2012-08-21: 1000 mL via INTRAVENOUS

## 2012-08-20 MED ORDER — ONDANSETRON HCL 4 MG/2ML IJ SOLN: 4.0000 mg | Freq: Four times a day (QID) | INTRAMUSCULAR | Status: DC | PRN

## 2012-08-20 MED ORDER — ONDANSETRON HCL 4 MG/2ML IJ SOLN: 4.0000 mg | Freq: Three times a day (TID) | INTRAMUSCULAR | Status: DC | PRN

## 2012-08-20 MED ORDER — NICOTINE 21 MG/24HR TD PT24
21.0000 mg | MEDICATED_PATCH | Freq: Every day | TRANSDERMAL | Status: DC
  Administered 2012-08-20 – 2012-08-22 (×3): 21 mg via TRANSDERMAL
  Filled 2012-08-20 (×3): qty 1

## 2012-08-20 NOTE — Progress Notes (Signed)
UR Chart Review Completed  

## 2012-08-20 NOTE — ED Notes (Signed)
Sitter at bedside.

## 2012-08-20 NOTE — Progress Notes (Signed)
Flu and Pneumonia shots given as ordered. Pt tolerated well.

## 2012-08-20 NOTE — ED Provider Notes (Signed)
History     CSN: [id number]  Arrival date & time 08/20/12  0051   First MD Initiated Contact with Patient 08/20/12 0055      Chief Complaint  Patient presents with  . Drug Overdose    (Consider location/radiation/quality/duration/timing/severity/associated sxs/prior treatment) HPI Comments: Level V caveat apply secondary to altered mental status after overdose.  The patient is a 21 year old female with a history of chronic back pain who also has depression and states that lately she has been having voices tell her that it's time to give up. This evening she reports taking multiple medications, overdoses of Xanax, Flexeril, Mobic, lisinopril, and possibly other medications which she does not remember. She was drinking a 40 ounce beer on paramedic arrival .She states that she does not think she was trying to kill her self, she is not to why she did that. The patient is slurring her words and falling asleep during the exam.  Patient is a 21 y.o. female presenting with Overdose. The history is provided by the patient, medical records and the EMS personnel.  Drug Overdose    Past Medical History  Diagnosis Date  . Chronic back pain     Past Surgical History  Procedure Date  . Mouth surgery   . Hand surgery     History reviewed. No pertinent family history.  History  Substance Use Topics  . Smoking status: Current Every Day Smoker -- 1.0 packs/day  . Smokeless tobacco: Not on file  . Alcohol Use: 0.0 oz/week    OB History    Grav Para Term Preterm Abortions TAB SAB Ect Mult Living                  Review of Systems  Unable to perform ROS: Mental status change    Allergies  Hydrocodone and Naproxen  Home Medications   Current Outpatient Rx  Name  Route  Sig  Dispense  Refill  . CYCLOBENZAPRINE HCL 10 MG PO TABS   Oral   Take 1 tablet (10 mg total) by mouth 3 (three) times daily as needed for muscle spasms.   21 tablet   0   . IBUPROFEN 200 MG PO TABS  Oral   Take 800 mg by mouth as needed. For pain         . OXYCODONE-ACETAMINOPHEN 5-325 MG PO TABS   Oral   Take 1 tablet by mouth every 4 (four) hours as needed for pain.   20 tablet   0     BP 91/48  Pulse 71  Temp 98.2 F (36.8 C) (Oral)  Resp 18  Ht 5\' 7"  (1.702 m)  Wt 160 lb (72.576 kg)  BMI 25.06 kg/m2  SpO2 97%  LMP 08/08/2012  Physical Exam  Nursing note and vitals reviewed. Constitutional: She appears well-developed and well-nourished.       Somnolent  HENT:  Head: Normocephalic and atraumatic.  Mouth/Throat: Oropharynx is clear and moist. No oropharyngeal exudate.  Eyes: Conjunctivae normal and EOM are normal. Pupils are equal, round, and reactive to light. Right eye exhibits no discharge. Left eye exhibits no discharge. No scleral icterus.  Neck: Normal range of motion. Neck supple. No JVD present. No thyromegaly present.  Cardiovascular: Normal rate, regular rhythm, normal heart sounds and intact distal pulses.  Exam reveals no gallop and no friction rub.   No murmur heard. Pulmonary/Chest: Effort normal and breath sounds normal. No respiratory distress. She has no wheezes. She has no rales.  Abdominal: Soft.  Bowel sounds are normal. She exhibits no distension and no mass. There is no tenderness.  Musculoskeletal: Normal range of motion. She exhibits no edema and no tenderness.  Lymphadenopathy:    She has no cervical adenopathy.  Neurological:       Somnolent, arousable, slurring words, falling asleep midsentence. Moves all extremities x4, generalized weakness  Skin: Skin is warm and dry. No rash noted. No erythema.  Psychiatric:       Patient admits to depression, auditory hallucinations    ED Course  Procedures (including critical care time)  Labs Reviewed  CBC - Abnormal; Notable for the following:    RDW 17.9 (*)     All other components within normal limits  COMPREHENSIVE METABOLIC PANEL - Abnormal; Notable for the following:    AST 43 (*)      ALT 37 (*)     Total Bilirubin 0.1 (*)     All other components within normal limits  ETHANOL - Abnormal; Notable for the following:    Alcohol, Ethyl (B) 201 (*)     All other components within normal limits  URINE RAPID DRUG SCREEN (HOSP PERFORMED) - Abnormal; Notable for the following:    Tetrahydrocannabinol POSITIVE (*)     All other components within normal limits  URINALYSIS, ROUTINE W REFLEX MICROSCOPIC - Abnormal; Notable for the following:    Color, Urine STRAW (*)     Specific Gravity, Urine <1.005 (*)     All other components within normal limits  SALICYLATE LEVEL - Abnormal; Notable for the following:    Salicylate Lvl 0.0 (*)     All other components within normal limits  PREGNANCY, URINE  ACETAMINOPHEN LEVEL  ACETAMINOPHEN LEVEL   No results found.   1. Overdose   2. Hypotension   3. Alcohol intoxication   4. Encephalopathy acute       MDM  Will rule out toxic ingestion such as salicylate, acetaminophen. Obtain EKG, patient will remain on cardiac monitor, will discuss with poison Center.   ED ECG REPORT  I personally interpreted this EKG   Date: 08/20/2012   Rate: 86  Rhythm: normal sinus rhythm  QRS Axis: normal  Intervals: normal  ST/T Wave abnormalities: normal  Conduction Disutrbances:none  Narrative Interpretation: At this time there is no QT or QRS prolongation, intervals appear normal  Old EKG Reviewed: none available   Discussed with poison control myself, they recommend observation for 6-8 hours, watching for hypotension related to lisinopril, altered mental status and hypotension related to Flexeril, Xanax and alcohol. Repeat EKG to be done after observation period to ensure no QRS prolongation. At this time the patient responded positively to Narcan, this lasted for about 10 minutes and the patient became more somnolent than his back at her arrival status. Repeat Narcan given, slight hypotension at 85 systolic, no tachycardia with this. IV  fluids opened up at this time.  ED ECG REPORT  I personally interpreted this EKG   Date: 08/20/2012   Rate: 71  Rhythm: normal sinus rhythm  QRS Axis: normal  Intervals: normal  ST/T Wave abnormalities: normal  Conduction Disutrbances:none  Narrative Interpretation:   Old EKG Reviewed: unchanged compared with EKG from earlier in the evening.  The patient has been persistently mildly hypotensive between 80 systolic and 100 systolic. She is not tachycardic but does have a persistent decreased level of consciousness. Her alcohol level was only 200, at the recommendations of poison control the patient will be admitted for observation due  to the hypotension in the setting of antihypertensives overdose.  The patient has required multiple re\re evaluations, blood pressure checks, IV fluids to help with blood pressure support and supplemental oxygenation secondary to decreased level of consciousness. There is no history of trauma and there is no evidence of trauma on the patient's body including her head and neck. Imaging of the head was not obtained secondary to the alcohol intoxication the patient readily admitted that she overdosed on multiple medications  Discussed with [facility] hospitalist who will admit.  Critical care delivered.  CRITICAL CARE Performed by: Vida Roller   Total critical care time: 35  Critical care time was exclusive of separately billable procedures and treating other patients.  Critical care was necessary to treat or prevent imminent or life-threatening deterioration.  Critical care was time spent personally by me on the following activities: development of treatment plan with patient and/or surrogate as well as nursing, discussions with consultants, evaluation of patient's response to treatment, examination of patient, obtaining history from patient or surrogate, ordering and performing treatments and interventions, ordering and review of laboratory studies, ordering  and review of radiographic studies, pulse oximetry and re-evaluation of patient's condition.   Vida Roller, MD 08/20/12 [phone]

## 2012-08-20 NOTE — ED Notes (Signed)
Followed up with Jake from poison control. Will repeat EKG

## 2012-08-20 NOTE — ED Notes (Signed)
Patient requested I call her family member Gardiner Ramus. Patient gave me the phone number verbally. Attempted to call family member unsuccessfully. Advised patient.

## 2012-08-20 NOTE — ED Notes (Signed)
Pt voided urine.
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
## 2012-08-20 NOTE — H&P (Signed)
[facility] History and Physical  LEVONNE CARRERAS YSA:[id number] DOB: [birth year] DOA: 08/20/2012  Referring physician: Dr. Hyacinth Meeker PCP: No primary provider on file.  Specialists:   Chief Complaint: overdose  HPI: Rhonda Good is a 21 y.o. female who reports a history of chronic back pain, bipolar disease. Patient was brought to the emergency room with altered mental status. Apparently she had taken multiple medications including Xanax, Flexeril, lisinopril, and possibly other medications. She had taken an unknown amount of each of these medications. She was also drinking alcohol. She denies any suicidal intent, but cannot tell me why she had taken those medications. She denies any recent stressors in her life. She does admit to hearing voices that have told her to hurt herself. She had told the emergency department staff that she heard these voices recently, which caused her to take these medications, but she tells me that she has not heard these voices recently. She reports being diagnosed with bipolar disease and had been prescribed medications in the past, but has been unable to obtain these recently. She was scheduled to follow up with outpatient mental health, but never did. She denies any history of prior suicide attempts. In the emergency department, she was found to be hypotensive. Case was discussed with poison control and medical admission for observation was recommended.  Review of Systems: Pertinent positives as per history of present illness, otherwise negative  Past Medical History  Diagnosis Date  . Chronic back pain    Past Surgical History  Procedure Date  . Mouth surgery   . Hand surgery    Social History:  reports that she has been smoking.  She does not have any smokeless tobacco history on file. She reports that she drinks alcohol. She reports that she uses illicit drugs (Marijuana).   Allergies  Allergen Reactions  . Hydrocodone Hives  . Naproxen Itching     Family history: Both her parents have diabetes. Emphysema is also in the family.   Prior to Admission medications   Medication Sig Start Date End Date Taking? Authorizing Provider  cyclobenzaprine (FLEXERIL) 10 MG tablet Take 1 tablet (10 mg total) by mouth 3 (three) times daily as needed for muscle spasms. 08/10/12  Yes Tammy L. Triplett, PA  ibuprofen (ADVIL,MOTRIN) 200 MG tablet Take 800 mg by mouth as needed. For pain   Yes Historical Provider, MD  oxyCODONE-acetaminophen (PERCOCET/ROXICET) 5-325 MG per tablet Take 1 tablet by mouth every 4 (four) hours as needed for pain. 08/10/12 08/20/12 Yes Tammy L. [place], [place]   Physical Exam: Filed Vitals:   08/20/12 0545 08/20/12 0600 08/20/12 0650 08/20/12 0715  BP: 84/46 91/48 80/38  89/49  Pulse: 70 71 76 80  Temp:      TempSrc:      Resp: 17 18 16 17   Height:      Weight:      SpO2: 96% 97% 95% 95%     General:  No acute distress, lying in bed and able to carry on a conversation.  Eyes: Pupils are equal and reactive to light  ENT: Mucous members are moist  Neck: Supple  Cardiovascular: S1, S2, regular rate and rhythm  Respiratory: Clear to auscultation bilaterally  Abdomen: Soft, nontender, nondistended, bowel sounds are active  Skin: Normal  Musculoskeletal: Deferred  Psychiatric: Somewhat lethargic, cooperative with exam, appears to have poor insight into her illness.  Neurologic: Grossly intact, nonfocal  Labs on Admission:  Basic Metabolic Panel:  Lab 08/20/12 3235  NA 140  K 3.7  CL 102  CO2 25  GLUCOSE 92  BUN 11  CREATININE 0.73  CALCIUM 9.5  MG --  PHOS --   Liver Function Tests:  Lab 08/20/12 0104  AST 43*  ALT 37*  ALKPHOS 83  BILITOT 0.1*  PROT 7.4  ALBUMIN 4.0   No results found for this basename: LIPASE:5,AMYLASE:5 in the last 168 hours No results found for this basename: AMMONIA:5 in the last 168 hours CBC:  Lab 08/20/12 0104  WBC 8.6  NEUTROABS --  HGB 13.6  HCT 41.4   MCV 88.7  PLT 285   Cardiac Enzymes: No results found for this basename: CKTOTAL:5,CKMB:5,CKMBINDEX:5,TROPONINI:5 in the last 168 hours  BNP (last 3 results) No results found for this basename: PROBNP:3 in the last 8760 hours CBG: No results found for this basename: GLUCAP:5 in the last 168 hours  Radiological Exams on Admission: No results found.  EKG: Per ED notes, this was reported to be normal. I do not see a scanned copy in the EMR.  Assessment/Plan Active Problems:  Encephalopathy acute  Overdose  Hypotension  Alcohol intoxication  Bipolar disorder   1. Polysubstance overdose. Patient was noted to be somewhat hypotensive and lethargic. She was monitored in the step down unit overnight for observation. We will continue with IV fluids and hold her chronic medications. Will continue the one-to-one sitter. We will obtain an ACT team consultation. Patient does appear to have some underlying mental illness, and she may benefit from an inpatient psychiatric stay. We'll defer to ACT team. 2. Hypotension. Likely secondary to medications. Continue IV fluids and monitor. 3. Acute encephalopathy. Likely related to alcohol and other substances. We'll continue to monitor.  Code Status: Full code Family Communication: Discussed with patient, no family present. Disposition Plan: She should likely be ready for discharge by tomorrow. Further disposition per ACT team consultation.  Time spent:  Wm Fruchter [facility] Pager [phone]  If 7PM-7AM, please contact night-coverage [email or web address] Password [facility] 08/20/2012, 9:47 AM

## 2012-08-20 NOTE — ED Notes (Addendum)
Per EMS - patient stated "I just want it to stop." Patient states in ED, "I'm not trying to kill myself, I just wanted to get high." Patient then states "I don't give a fuck if I die, I want to see my granny anyway."

## 2012-08-20 NOTE — ED Notes (Addendum)
Patient stated "I took about 7 flexeril, 8 lisinopril, 10 mobic, 10-12 celebrex, 4 xanax, 2 lyrica, and some other non-narcotic medicine.

## 2012-08-21 LAB — CBC
HCT: 38.8 % (ref 36.0–46.0)
Hemoglobin: 12.6 g/dL (ref 12.0–15.0)
MCH: 28.7 pg (ref 26.0–34.0)
MCHC: 32.5 g/dL (ref 30.0–36.0)
RDW: 17.9 % — ABNORMAL HIGH (ref 11.5–15.5)

## 2012-08-21 LAB — COMPREHENSIVE METABOLIC PANEL
Albumin: 3.7 g/dL (ref 3.5–5.2)
BUN: 10 mg/dL (ref 6–23)
Calcium: 9.1 mg/dL (ref 8.4–10.5)
GFR calc Af Amer: >90 mL/min (ref >90)
Glucose, Bld: 78 mg/dL (ref 70–99)
Sodium: 137 mEq/L (ref 135–145)
Total Protein: 6.7 g/dL (ref 6.0–8.3)

## 2012-08-21 MED ORDER — PREGABALIN 75 MG PO CAPS
75.0000 mg | ORAL_CAPSULE | Freq: Every day | ORAL | Status: DC
  Administered 2012-08-21 – 2012-08-22 (×2): 75 mg via ORAL
  Filled 2012-08-21 (×2): qty 1

## 2012-08-21 MED ORDER — ADULT MULTIVITAMIN W/MINERALS CH
1.0000 | ORAL_TABLET | Freq: Every day | ORAL | Status: DC
  Administered 2012-08-21 – 2012-08-22 (×2): 1 via ORAL
  Filled 2012-08-21 (×2): qty 1

## 2012-08-21 MED ORDER — LORAZEPAM 2 MG/ML IJ SOLN: 1.0000 mg | Freq: Four times a day (QID) | INTRAMUSCULAR | Status: DC | PRN

## 2012-08-21 MED ORDER — LOPERAMIDE HCL 2 MG PO CAPS
2.0000 mg | ORAL_CAPSULE | ORAL | Status: DC | PRN
  Administered 2012-08-21: 2 mg via ORAL
  Filled 2012-08-21: qty 1

## 2012-08-21 MED ORDER — OXYCODONE-ACETAMINOPHEN 5-325 MG PO TABS
1.0000 | ORAL_TABLET | Freq: Four times a day (QID) | ORAL | Status: DC | PRN
  Administered 2012-08-21 – 2012-08-22 (×4): 1 via ORAL
  Filled 2012-08-21 (×4): qty 1

## 2012-08-21 MED ORDER — LORAZEPAM 0.5 MG PO TABS
1.0000 mg | ORAL_TABLET | Freq: Four times a day (QID) | ORAL | Status: DC | PRN
  Administered 2012-08-21: 1 mg via ORAL
  Filled 2012-08-21: qty 2

## 2012-08-21 MED ORDER — VITAMIN B-1 100 MG PO TABS
100.0000 mg | ORAL_TABLET | Freq: Every day | ORAL | Status: DC
  Administered 2012-08-21 – 2012-08-22 (×2): 100 mg via ORAL
  Filled 2012-08-21 (×2): qty 1

## 2012-08-21 MED ORDER — CHLORDIAZEPOXIDE HCL 25 MG PO CAPS: 25.0000 mg | ORAL_CAPSULE | Freq: Three times a day (TID) | ORAL | Status: DC | PRN

## 2012-08-21 MED ORDER — THIAMINE HCL 100 MG/ML IJ SOLN: 100.0000 mg | Freq: Every day | INTRAMUSCULAR | Status: DC

## 2012-08-21 MED ORDER — LORAZEPAM 0.5 MG PO TABS: 0.0000 mg | ORAL_TABLET | Freq: Two times a day (BID) | ORAL | Status: DC

## 2012-08-21 MED ORDER — FOLIC ACID 1 MG PO TABS
1.0000 mg | ORAL_TABLET | Freq: Every day | ORAL | Status: DC
  Administered 2012-08-21 – 2012-08-22 (×2): 1 mg via ORAL
  Filled 2012-08-21 (×2): qty 1

## 2012-08-21 MED ORDER — LORAZEPAM 0.5 MG PO TABS
0.0000 mg | ORAL_TABLET | Freq: Four times a day (QID) | ORAL | Status: DC
Start: 2012-08-21 — End: 2012-08-22
  Administered 2012-08-21 – 2012-08-22 (×3): 1 mg via ORAL
  Filled 2012-08-21: qty 1
  Filled 2012-08-21: qty 2
  Filled 2012-08-21: qty 1
  Filled 2012-08-21: qty 2

## 2012-08-21 NOTE — Progress Notes (Signed)
Called [facility] and [facility] about bed request for patient.  OV states that the patient is on the waiting list and [facility] states that they want her to be monitored here for another 24 hours before they can accept her.  Md notified and spoke with patient d/t patient stating she wanted to go home.  Involuntary commitment papers filled out again.  Patients IV leaking and hurting - removed.  OK to leave IV out per MD. Has had no IV needs.  Taking PO and tolerating well.

## 2012-08-21 NOTE — Progress Notes (Signed)
It was brought to my attention that the patients room had not been cleared so i removed all plastic bags, soda cans and room phone. Also, patient has set up a password for anyone who calls and wants to speak with her. The password is "[email or web address]" which is her sisters name.

## 2012-08-21 NOTE — Discharge Summary (Signed)
Physician Discharge Summary  Miela Desjardin Xxxlittle ZHY:[id number] DOB: October 20, 1990 DOA: 08/20/2012  PCP: No primary provider on file.  Admit date: 08/20/2012 Discharge date: To be determined  Time spent: 35 minutes  Recommendations for Outpatient Follow-up:  1. Patient will be discharged to inpatient psychiatric facility for continued care  Discharge Diagnoses:  Active Problems:  Encephalopathy acute  Overdose  Hypotension  Alcohol intoxication  Bipolar disorder   Discharge Condition: improved  Diet recommendation: regular diet  Filed Weights   08/20/12 0053 08/20/12 0054  Weight: 72.576 kg (160 lb) 72.576 kg (160 lb)    History of present illness:  Rhonda Good is a 21 y.o. female who reports a history of chronic back pain, bipolar disease. Patient was brought to the emergency room with altered mental status. Apparently she had taken multiple medications including Xanax, Flexeril, lisinopril, and possibly other medications. She had taken an unknown amount of each of these medications. She was also drinking alcohol. She denies any suicidal intent, but cannot tell me why she had taken those medications. She denies any recent stressors in her life. She does admit to hearing voices that have told her to hurt herself. She had told the emergency department staff that she heard these voices recently, which caused her to take these medications, but she tells me that she has not heard these voices recently. She reports being diagnosed with bipolar disease and had been prescribed medications in the past, but has been unable to obtain these recently. She was scheduled to follow up with outpatient mental health, but never did. She denies any history of prior suicide attempts. In the emergency department, she was found to be hypotensive. Case was discussed with poison control and medical admission for observation was recommended.   Hospital Course:  This lady was admitted to the hospital after  taking an unknown amount of multiple medications. She was found to be lethargic and hypotensive on arrival. Case had been discussed with poison control and medical admission for observation was recommended. Patient was monitored in the hospital and was started on IV fluids. She was maintained on a one-to-one sitter. Her blood pressure improved and is currently back in normal range. Her mental status is also improving she is awake and alert. She was seen by the ACT team and it was agreed that patient would benefit from inpatient psychiatric care. She does have a history of bipolar which is currently untreated. She's been placed on involuntary commitment papers. She is cleared for discharge from a medical standpoint once a bed is available at the psychiatric facility. She has been started on low dose benzodiazepines to prevent any alcohol withdrawal.  Procedures:  none  Consultations:  ACT team  Discharge Exam: Filed Vitals:   08/20/12 1630 08/20/12 1755 08/20/12 1951 08/21/12 0848  BP:  102/51 116/83 109/74  Pulse:   70 69  Temp: 97.8 F (36.6 C)  98.1 F (36.7 C) 97.6 F (36.4 C)  TempSrc: Oral  Oral   Resp:   20 14  Height:      Weight:      SpO2:   99% 99%    General: NAD Cardiovascular: S1, s2, rrr Respiratory: CTA B  Discharge Instructions     Medication List     As of 08/21/2012  8:49 AM    STOP taking these medications         oxyCODONE-acetaminophen 5-325 MG per tablet   Commonly known as: PERCOCET/ROXICET      TAKE these  medications         chlordiazePOXIDE 25 MG capsule   Commonly known as: LIBRIUM   Take 1 capsule (25 mg total) by mouth 3 (three) times daily as needed for anxiety.      cyclobenzaprine 10 MG tablet   Commonly known as: FLEXERIL   Take 1 tablet (10 mg total) by mouth 3 (three) times daily as needed for muscle spasms.      ibuprofen 200 MG tablet   Commonly known as: ADVIL,MOTRIN   Take 800 mg by mouth as needed. For pain            The results of significant diagnostics from this hospitalization (including imaging, microbiology, ancillary and laboratory) are listed below for reference.    Significant Diagnostic Studies: No results found.  Microbiology: Recent Results (from the past 240 hour(s))  MRSA PCR SCREENING     Status: Normal   Collection Time   08/20/12  7:54 AM      Component Value Range Status Comment   MRSA by PCR NEGATIVE  NEGATIVE Final      Labs: Basic Metabolic Panel:  Lab 08/21/12 4098 08/20/12 1000 08/20/12 0104  NA 137 -- 140  K 3.3* -- 3.7  CL 105 -- 102  CO2 23 -- 25  GLUCOSE 78 -- 92  BUN 10 -- 11  CREATININE 0.57 0.68 0.73  CALCIUM 9.1 -- 9.5  MG -- -- --  PHOS -- -- --   Liver Function Tests:  Lab 08/21/12 0441 08/20/12 0104  AST 20 43*  ALT 24 37*  ALKPHOS 75 83  BILITOT 0.6 0.1*  PROT 6.7 7.4  ALBUMIN 3.7 4.0   No results found for this basename: LIPASE:5,AMYLASE:5 in the last 168 hours No results found for this basename: AMMONIA:5 in the last 168 hours CBC:  Lab 08/21/12 0441 08/20/12 1000 08/20/12 0104  WBC 6.8 4.7 8.6  NEUTROABS -- -- --  HGB 12.6 12.1 13.6  HCT 38.8 37.2 41.4  MCV 88.4 89.4 88.7  PLT 226 245 285   Cardiac Enzymes: No results found for this basename: CKTOTAL:5,CKMB:5,CKMBINDEX:5,TROPONINI:5 in the last 168 hours BNP: BNP (last 3 results) No results found for this basename: PROBNP:3 in the last 8760 hours CBG: No results found for this basename: GLUCAP:5 in the last 168 hours     Signed:  Akanksha Bellmore  [facility] 08/21/2012, 8:49 AM

## 2012-08-22 ENCOUNTER — Inpatient Hospital Stay (HOSPITAL_COMMUNITY)
Admission: AD | Admit: 2012-08-22 | Discharge: 2012-08-25 | DRG: 897 | Disposition: A | Discharge status: Home / Self Care | Payer: 59 | Source: Ambulatory Visit | Attending: Psychiatry | Admitting: Psychiatry

## 2012-08-22 ENCOUNTER — Encounter (HOSPITAL_COMMUNITY): Payer: Self-pay | Admitting: *Deleted

## 2012-08-22 DIAGNOSIS — F1994 Other psychoactive substance use, unspecified with psychoactive substance-induced mood disorder: Secondary | ICD-10-CM

## 2012-08-22 DIAGNOSIS — T50901A Poisoning by unspecified drugs, medicaments and biological substances, accidental (unintentional), initial encounter: Secondary | ICD-10-CM

## 2012-08-22 DIAGNOSIS — F17200 Nicotine dependence, unspecified, uncomplicated: Secondary | ICD-10-CM | POA: Diagnosis present

## 2012-08-22 DIAGNOSIS — F319 Bipolar disorder, unspecified: Secondary | ICD-10-CM

## 2012-08-22 DIAGNOSIS — F10988 Alcohol use, unspecified with other alcohol-induced disorder: Secondary | ICD-10-CM | POA: Diagnosis present

## 2012-08-22 DIAGNOSIS — F10929 Alcohol use, unspecified with intoxication, unspecified: Secondary | ICD-10-CM

## 2012-08-22 DIAGNOSIS — I959 Hypotension, unspecified: Secondary | ICD-10-CM

## 2012-08-22 DIAGNOSIS — Z79899 Other long term (current) drug therapy: Secondary | ICD-10-CM

## 2012-08-22 DIAGNOSIS — F1020 Alcohol dependence, uncomplicated: Principal | ICD-10-CM | POA: Diagnosis present

## 2012-08-22 DIAGNOSIS — G9340 Encephalopathy, unspecified: Secondary | ICD-10-CM

## 2012-08-22 MED ORDER — LOPERAMIDE HCL 2 MG PO CAPS: 2.0000 mg | ORAL_CAPSULE | ORAL | Status: DC | PRN

## 2012-08-22 MED ORDER — CHLORDIAZEPOXIDE HCL 25 MG PO CAPS
25.0000 mg | ORAL_CAPSULE | Freq: Four times a day (QID) | ORAL | Status: AC
  Administered 2012-08-22 – 2012-08-23 (×5): 25 mg via ORAL
  Filled 2012-08-22 (×6): qty 1

## 2012-08-22 MED ORDER — VITAMIN B-1 100 MG PO TABS
100.0000 mg | ORAL_TABLET | Freq: Every day | ORAL | Status: DC
  Administered 2012-08-23 – 2012-08-25 (×3): 100 mg via ORAL
  Filled 2012-08-22 (×5): qty 1

## 2012-08-22 MED ORDER — CHLORDIAZEPOXIDE HCL 25 MG PO CAPS
25.0000 mg | ORAL_CAPSULE | Freq: Once | ORAL | Status: AC
  Administered 2012-08-22: 25 mg via ORAL
  Filled 2012-08-22: qty 1

## 2012-08-22 MED ORDER — CHLORDIAZEPOXIDE HCL 25 MG PO CAPS
25.0000 mg | ORAL_CAPSULE | ORAL | Status: DC
  Administered 2012-08-25: 25 mg via ORAL

## 2012-08-22 MED ORDER — CHLORDIAZEPOXIDE HCL 25 MG PO CAPS
25.0000 mg | ORAL_CAPSULE | Freq: Four times a day (QID) | ORAL | Status: DC | PRN
  Administered 2012-08-22 – 2012-08-25 (×3): 25 mg via ORAL
  Filled 2012-08-22 (×2): qty 1

## 2012-08-22 MED ORDER — TRAZODONE HCL 50 MG PO TABS
50.0000 mg | ORAL_TABLET | Freq: Every evening | ORAL | Status: DC | PRN
  Administered 2012-08-22 – 2012-08-23 (×3): 50 mg via ORAL
  Filled 2012-08-22 (×4): qty 1

## 2012-08-22 MED ORDER — CHLORDIAZEPOXIDE HCL 25 MG PO CAPS
25.0000 mg | ORAL_CAPSULE | Freq: Every day | ORAL | Status: DC
  Filled 2012-08-22: qty 1

## 2012-08-22 MED ORDER — ADULT MULTIVITAMIN W/MINERALS CH
1.0000 | ORAL_TABLET | Freq: Every day | ORAL | Status: DC
  Administered 2012-08-22 – 2012-08-25 (×4): 1 via ORAL
  Filled 2012-08-22 (×6): qty 1

## 2012-08-22 MED ORDER — ONDANSETRON 4 MG PO TBDP: 4.0000 mg | ORAL_TABLET | Freq: Four times a day (QID) | ORAL | Status: DC | PRN

## 2012-08-22 MED ORDER — HYDROXYZINE HCL 25 MG PO TABS
25.0000 mg | ORAL_TABLET | Freq: Four times a day (QID) | ORAL | Status: DC | PRN
  Administered 2012-08-22: 25 mg via ORAL
  Filled 2012-08-22: qty 25

## 2012-08-22 MED ORDER — THIAMINE HCL 100 MG/ML IJ SOLN
100.0000 mg | Freq: Once | INTRAMUSCULAR | Status: AC
  Administered 2012-08-22: 100 mg via INTRAMUSCULAR

## 2012-08-22 MED ORDER — CHLORDIAZEPOXIDE HCL 25 MG PO CAPS
25.0000 mg | ORAL_CAPSULE | Freq: Three times a day (TID) | ORAL | Status: AC
  Administered 2012-08-24 (×2): 25 mg via ORAL
  Filled 2012-08-22 (×3): qty 1

## 2012-08-22 MED ORDER — IBUPROFEN 800 MG PO TABS
800.0000 mg | ORAL_TABLET | Freq: Three times a day (TID) | ORAL | Status: DC | PRN
  Administered 2012-08-22 – 2012-08-25 (×5): 800 mg via ORAL
  Filled 2012-08-22 (×3): qty 1
  Filled 2012-08-22: qty 15
  Filled 2012-08-22 (×2): qty 1

## 2012-08-22 NOTE — Progress Notes (Signed)
Report called to the nurse at behavioral health.  Caregiver verbalized understanding of report. Patient awaiting for [facility] for transport.
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
## 2012-08-22 NOTE — Progress Notes (Signed)
Psychoeducational Group Note  Date:  08/22/2012 Time:  2000  Group Topic/Focus:  AA group  Participation Level:  Active  Participation Quality:  Appropriate  Affect:  Appropriate  Cognitive:  Alert  Insight:  Good  Engagement in Group:  Good  Additional Comments:    Jennavieve Arrick R 08/22/2012, 8:34 PM

## 2012-08-22 NOTE — Care Management Note (Signed)
    Page 1 of 1   08/22/2012     12:09:53 PM   CARE MANAGEMENT NOTE 08/22/2012  Patient:  RANEY, ANTWINE   Account Number:  [id number]  Date Initiated:  08/22/2012  Documentation initiated by:  Sharrie Rothman  Subjective/Objective Assessment:   Pt admitted from home with drug overdose. ACT is recommending inpatient psych treatment.     Action/Plan:   Shon Baton from ACT has arranged bed with [facility] in [place].   Anticipated DC Date:  08/22/2012   Anticipated DC Plan:  PSYCHIATRIC HOSPITAL      DC Planning Services  CM consult      Choice offered to / List presented to:             Status of service:  Completed, signed off Medicare Important Message given?   (If response is "NO", the following Medicare IM given date fields will be blank) Date Medicare IM given:   Date Additional Medicare IM given:    Discharge Disposition:  PSYCHIATRIC HOSPITAL  Per UR Regulation:    If discussed at Long Length of Stay Meetings, dates discussed:    Comments:  08/22/12 1209 Arlyss Queen, RN BSN CM

## 2012-08-22 NOTE — BH Assessment (Signed)
[facility] Assessment Progress Note    08/22/2012  11:33 AM  Called back to [place] in access as it has been over one hour since authorization was requested. Tasia Catchings called back, stating computer issues. Gave additional clinical information as requested. Patient was authorized for 3 days, from 08/22/2012 thru 08/24/2012, authorization number : [id number], given by Tasia Catchings. IVC paperwork also updated due to others expiring in a few hours.   Shon Baton, MSW, LCSW, LCASA, CSW-G
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
## 2012-08-22 NOTE — BH Assessment (Signed)
[facility] Assessment Progress Note      08/22/2012 10:22 AM Contacted [facility] for sponsorship authorization. Patient is medically cleared and has been accepted to [facility] by Dr. Herbert Pun. Patient is under IVC. Gave clinical information to [facility]; access states it will be about an hour before they call back with authorization is completed.   Completed other necessary paperwork to necessitate transfer to [facility].  Shon Baton, MSW, LCSW, LCASA, CSW-G

## 2012-08-22 NOTE — Progress Notes (Signed)
[facility]             Progress Note   Subjective: Patient denies any complaints.  Objective: Vital signs in last 24 hours: Temp:  [97.6 F (36.4 C)-98.3 F (36.8 C)] 97.6 F (36.4 C) (11/29 0620) Pulse Rate:  [56-100] 56  (11/29 0620) Resp:  [20] 20  (11/29 0620) BP: (112-133)/(77-91) 112/77 mmHg (11/29 0620) SpO2:  [96 %-98 %] 96 % (11/29 0620) Weight change:  Last BM Date: 08/21/12  Intake/Output from previous day: 11/28 0701 - 11/29 0700 In: 2346.7 [P.O.:720; I.V.:1626.7] Out: 4700 [Urine:4700] Total I/O In: -  Out: 200 [Urine:200]   Physical Exam: General: Alert, awake, oriented x3, in no acute distress. HEENT: No bruits, no goiter. Heart: Regular rate and rhythm, without murmurs, rubs, gallops. Lungs: Clear to auscultation bilaterally. Abdomen: Soft, nontender, nondistended, positive bowel sounds. Extremities: No clubbing cyanosis or edema with positive pedal pulses. Neuro: Grossly intact, nonfocal.    Lab Results: Basic Metabolic Panel:  Basename 08/21/12 0441 08/20/12 1000 08/20/12 0104  NA 137 -- 140  K 3.3* -- 3.7  CL 105 -- 102  CO2 23 -- 25  GLUCOSE 78 -- 92  BUN 10 -- 11  CREATININE 0.57 0.68 --  CALCIUM 9.1 -- 9.5  MG -- -- --  PHOS -- -- --   Liver Function Tests:  Basename 08/21/12 0441 08/20/12 0104  AST 20 43*  ALT 24 37*  ALKPHOS 75 83  BILITOT 0.6 0.1*  PROT 6.7 7.4  ALBUMIN 3.7 4.0   No results found for this basename: LIPASE:2,AMYLASE:2 in the last 72 hours No results found for this basename: AMMONIA:2 in the last 72 hours CBC:  Basename 08/21/12 0441 08/20/12 1000  WBC 6.8 4.7  NEUTROABS -- --  HGB 12.6 12.1  HCT 38.8 37.2  MCV 88.4 89.4  PLT 226 245   Cardiac Enzymes: No results found for this basename: CKTOTAL:3,CKMB:3,CKMBINDEX:3,TROPONINI:3 in the last 72 hours BNP: No results found for this basename: PROBNP:3 in the last 72 hours D-Dimer: No results found for this basename: DDIMER:2 in the  last 72 hours CBG: No results found for this basename: GLUCAP:6 in the last 72 hours Hemoglobin A1C: No results found for this basename: HGBA1C in the last 72 hours Fasting Lipid Panel: No results found for this basename: CHOL,HDL,LDLCALC,TRIG,CHOLHDL,LDLDIRECT in the last 72 hours Thyroid Function Tests: No results found for this basename: TSH,T4TOTAL,FREET4,T3FREE,THYROIDAB in the last 72 hours Anemia Panel: No results found for this basename: VITAMINB12,FOLATE,FERRITIN,TIBC,IRON,RETICCTPCT in the last 72 hours Coagulation: No results found for this basename: LABPROT:2,INR:2 in the last 72 hours Urine Drug Screen: Drugs of Abuse     Component Value Date/Time   LABOPIA NONE DETECTED 08/20/2012 0127   COCAINSCRNUR NONE DETECTED 08/20/2012 0127   LABBENZ NONE DETECTED 08/20/2012 0127   AMPHETMU NONE DETECTED 08/20/2012 0127   THCU POSITIVE* 08/20/2012 0127   LABBARB NONE DETECTED 08/20/2012 0127    Alcohol Level:  Basename 08/20/12 0104  ETH 201*   Urinalysis:  Basename 08/20/12 0127  COLORURINE STRAW*  LABSPEC <1.005*  PHURINE 6.0  GLUCOSEU NEGATIVE  HGBUR NEGATIVE  BILIRUBINUR NEGATIVE  KETONESUR NEGATIVE  PROTEINUR NEGATIVE  UROBILINOGEN 0.2  NITRITE NEGATIVE  LEUKOCYTESUR NEGATIVE    Recent Results (from the past 240 hour(s))  MRSA PCR SCREENING     Status: Normal   Collection Time   08/20/12  7:54 AM      Component Value Range Status Comment   MRSA by PCR NEGATIVE  NEGATIVE  Final     Studies/Results: No results found.  Medications: Scheduled Meds:   . enoxaparin (LOVENOX) injection  40 mg Subcutaneous Q24H  . folic acid  1 mg Oral Daily  . LORazepam  0-4 mg Oral Q6H   Followed by  . LORazepam  0-4 mg Oral Q12H  . multivitamin with minerals  1 tablet Oral Daily  . nicotine  21 mg Transdermal Daily  . pregabalin  75 mg Oral Daily  . thiamine  100 mg Oral Daily   Or  . thiamine  100 mg Intravenous Daily   Continuous Infusions:  PRN  Meds:.acetaminophen, acetaminophen, loperamide, LORazepam, LORazepam, naLOXone (NARCAN)  injection, ondansetron (ZOFRAN) IV, ondansetron, oxyCODONE-acetaminophen  Assessment/Plan:  Active Problems:  Encephalopathy acute  Overdose  Hypotension  Alcohol intoxication  Bipolar disorder  Patient admitted with overdose of multiple medications and alcohol. Patient suicidal ideations at admission. She has a history of untreated bipolar disorder. Plan will be to transfer the patient to inpatient psychiatric facility for further care. She is on involuntary commitment papers. Currently awaiting a bed. She is medically stable for discharge.  Time spent coordinating care:   LOS: 2 days   Eusebio Blazejewski [facility] Pager: [phone] 08/22/2012, 8:51 AM

## 2012-08-23 ENCOUNTER — Encounter (HOSPITAL_COMMUNITY): Payer: Self-pay | Admitting: Psychiatry

## 2012-08-23 DIAGNOSIS — F1020 Alcohol dependence, uncomplicated: Principal | ICD-10-CM

## 2012-08-23 DIAGNOSIS — F1994 Other psychoactive substance use, unspecified with psychoactive substance-induced mood disorder: Secondary | ICD-10-CM

## 2012-08-23 MED ORDER — CYCLOBENZAPRINE HCL 10 MG PO TABS
10.0000 mg | ORAL_TABLET | Freq: Three times a day (TID) | ORAL | Status: DC | PRN
  Administered 2012-08-23 – 2012-08-24 (×6): 10 mg via ORAL
  Filled 2012-08-23 (×7): qty 1

## 2012-08-23 MED ORDER — GABAPENTIN 300 MG PO CAPS
300.0000 mg | ORAL_CAPSULE | Freq: Three times a day (TID) | ORAL | Status: DC
  Administered 2012-08-23 – 2012-08-25 (×8): 300 mg via ORAL
  Filled 2012-08-23 (×2): qty 1
  Filled 2012-08-23: qty 42
  Filled 2012-08-23 (×9): qty 1
  Filled 2012-08-23: qty 42
  Filled 2012-08-23: qty 1
  Filled 2012-08-23: qty 42
  Filled 2012-08-23: qty 1

## 2012-08-23 MED ORDER — NICOTINE 21 MG/24HR TD PT24
21.0000 mg | MEDICATED_PATCH | Freq: Every day | TRANSDERMAL | Status: DC
  Administered 2012-08-23 – 2012-08-25 (×3): 21 mg via TRANSDERMAL
  Filled 2012-08-23 (×6): qty 1
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
## 2012-08-23 NOTE — Progress Notes (Signed)
Psychoeducational Group Note  Date: 08/23/2012 Time:  1015  Group Topic/Focus:  Identifying Needs:   The focus of this group is to help patients identify their personal needs that have been historically problematic and identify healthy behaviors to address their needs.  Participation Level:  Did Not Attend   Dione Housekeeper

## 2012-08-23 NOTE — Clinical Social Work Note (Signed)
[facility] Group Notes: (Clinical Social Work)   08/23/2012  10-11am   Type of Therapy:  Group Therapy   Participation Level:  Did Not Attend    Ambrose Mantle, LCSW 08/23/2012, 10:56 AM

## 2012-08-23 NOTE — Progress Notes (Signed)
Goals : Group Note  Date:  08/23/2012 Time:  0900  Group Topic/Focus:  Goals Group:   The focus of this group is to help patients establish daily goals to achieve during treatment, it introduces their Saturday Patient Workbook to them and it helps motivate them to begin to take the steps they need to..influenza order to become healthier individuals.  Participation Level: Did Not Attend  Participation Quality:  Not Applicable  Affect:  Not Applicable  Cognitive:  Not Applicable  Insight:  Not Applicable  Engagement in Group: Not Applicable  Additional Comments:    Rich Brave 08/23/2012, 9:53 AM

## 2012-08-23 NOTE — Progress Notes (Addendum)
D Pt slept through morning med pass as well as both groups this morning. She completed her self inventory today and on it she wrote she denied SI within the past 24 hrs, she rated her depression and hopelessness" 2/n/a" and she wrote her DC plan is to " not to drink".    A She is somatically focused but is cooperative and pleasant when this nurse tries to process with her and to encourage her to stay out of her room...attend her groups and try to do her workbook.  R Safety is in place and POC supported with therapeutic relationship fostered.

## 2012-08-23 NOTE — [facility] Suicide Risk Assessment (Signed)
Suicide Risk Assessment  Admission Assessment     Nursing information obtained from:  Patient Demographic factors:  Adolescent or young adult;Caucasian;Low socioeconomic status Current Mental Status:  NA Loss Factors:  NA Historical Factors:  Family history of mental illness or substance abuse Risk Reduction Factors:  Living with another person, especially a relative;Positive social support;Positive therapeutic relationship  CLINICAL FACTORS:   Depression:   Comorbid alcohol abuse/dependence Alcohol/Substance Abuse/Dependencies  COGNITIVE FEATURES THAT CONTRIBUTE TO RISK: None identified   SUICIDE RISK:   Mild:  Suicidal ideation of limited frequency, intensity, duration, and specificity.  There are no identifiable plans, no associated intent, mild dysphoria and related symptoms, good self-control (both objective and subjective assessment), few other risk factors, and identifiable protective factors, including available and accessible social support.  PLAN OF CARE: Detox with Librium                              Reassess co morbidities   Luann Aspinwall A 08/23/2012, 10:38 AM

## 2012-08-24 MED ORDER — TRAZODONE HCL 100 MG PO TABS
100.0000 mg | ORAL_TABLET | Freq: Every evening | ORAL | Status: DC | PRN
  Administered 2012-08-24: 100 mg via ORAL
  Filled 2012-08-24: qty 1
  Filled 2012-08-24: qty 28

## 2012-08-24 NOTE — Progress Notes (Signed)
Patient ID: Rhonda Good, female   DOB: 1990-12-07, 21 y.o.   MRN: [id number] D)  Affect is rather flat, sad, upset with herself for OD,  States is thankful God has given her another chance and wants to get herself straightened out so that she can help others.  States doesn't remember much, but was told she had said she wanted to go be with her grandmother who died not long ago.  States knows she has a lot ahead of her, wants to live her life in the right way now, doesn't want to drink .  A)   Was given support and encouragement, will continue to monitor for safety.  R)  Receptive, seems sincere. Safety maintained.

## 2012-08-24 NOTE — Progress Notes (Signed)
Psychoeducational Group Note  Date:  08/24/2012 Time:  1015  Group Topic/Focus:  Making Healthy Choices:   The focus of this group is to help patients identify negative/unhealthy choices they were using prior to admission and identify positive/healthier coping strategies to replace them upon discharge.  Participation Level:  Active  Participation Quality:  Appropriate  Affect:  Appropriate  Cognitive:  Appropriate  Insight:  Good  Engagement in Group:  Good  Additional Comments:    Idabell Picking A 08/24/2012  

## 2012-08-24 NOTE — Clinical Social Work Note (Signed)
[facility] Group Notes:  (Clinical Social Work)  08/24/2012  10:00-11:00AM  Summary of Progress/Problems:   The main focus of today's process group was for the patient to define "support" and describe what healthy supports are, then to identify the patient's current support system and decide on other supports that can be put in place to prevent future hospitalizations.   An emphasis was placed on using therapist, doctor and problem-specific support groups to expand supports.  The patient expressed that she has many family supports in place including grandparents on both sides, mother, [place] sister.  She stated her [place] sister is her motivation for her sobriety.  CSW challenged her to talk with other patients about this idea.  She stated she intends to go to [employer] and get a sponsor.  CSW informed her that the normal recommendation for someone first coming out of treatment is 90 meetings in 90 days, and she had never heard this, but is willing to comply.  Type of Therapy:  Group Therapy  Participation Level:  Active  Participation Quality:  Appropriate, Attentive, Sharing and Supportive  Affect:  Appropriate  Cognitive:  Alert, Appropriate and Oriented  Insight:  Good  Engagement in Group:  Good  Engagement in Therapy:  Good  Modes of Intervention:  Clarification, Education, Limit-setting, Problem-solving, Socialization, Support and Processing   Ambrose Mantle, LCSW 08/24/2012,

## 2012-08-24 NOTE — Progress Notes (Signed)
[facility] Group Notes:  (Counselor/Nursing/MHT/Case Management/Adjunct)  08/23/2012 2100 Type of Therapy:  wrap up group  Participation Level:  Active  Participation Quality:  Appropriate, Attentive and Sharing  Affect:  Appropriate  Cognitive:  Alert and Appropriate  Insight:  Good  Engagement in Group:  Good  Engagement in Therapy:  Good  Modes of Intervention:  Clarification, Education and Support  Summary of Progress/Problems: Pt reports being grateful for her life because she "almost lost it".  Pt reports feeling better.  When asked if it was physical, emotional, or mentally pt reported all of the above.   Rhonda Good 08/24/2012, 1:09 AM

## 2012-08-24 NOTE — Progress Notes (Signed)
Rhonda Good is up first thing this morning. She is bright-eyed. She smiles, states she slept last night and that she's " ready " for the day! She takes her AM meds as ordered, she is attending her groups and asks this Clinical research associate " when do you think I can go home???"  A She completed her AM self inventory , she rates her depression and hopelessness " 1/1" and  adds on the question about having suicidal ideation  " never SI". She writes her DC plan is to: " not drink, stay with my grandparents and go back to school in Jan".  R Safety is in place and POC cont with therapeutic relationship foatered.

## 2012-08-24 NOTE — Progress Notes (Signed)
[facility] MD Progress Note  08/24/2012 2:50 PM Rhonda Good  MRN:  [id number]  Diagnosis:  Alcohol Dependence, Substance Induced Mood Disorder  ADL's:  Intact  Sleep: Poor  Appetite:  Fair  Suicidal Ideation:  Plan:  Denies Intent:  Denies Means:  Denies Homicidal Ideation:  Plan:  Denies Intent:  Denies Means:  Denies  Endorses that she has never felt this committed to make this work. She is committed to abstinence. She admits she feels better, clear headed. Called her [place] sister who saw her in the hospital and promised her that she will never again see her like this. She is planning to get back in school. She is going to stay with her grandparents. This is Good safe place with no drugs or alcohol Mental Status Examination/Evaluation: Objective:  Appearance: Fairly Groomed  Patent attorney::  Fair  Speech:  Clear and Coherent and Normal Rate  Volume:  Normal  Mood:  Better, still worried  Affect:  Appropriate  Thought Process:  Coherent and Goal Directed  Orientation:  Full  Thought Content:  her insights, her regrets, her plans  Suicidal Thoughts:  No  Homicidal Thoughts:  No  Memory:  Immediate;   Fair Recent;   Fair Remote;   Fair  Judgement:  Fair  Insight:  Present  Psychomotor Activity:  Normal  Concentration:  Fair  Recall:  Fair  Akathisia:  No  Handed:  Right  AIMS (if indicated):     Assets:  Communication Skills Desire for Improvement Social Support  Sleep:  Number of Hours: 4    Vital Signs:Blood pressure 109/74, pulse 92, temperature 97.4 F (36.3 C), temperature source Oral, resp. rate 20, height 5\' 6"  (1.676 m), last menstrual period 08/08/2012, SpO2 98.00%. Current Medications: Current Facility-Administered Medications  Medication Dose Route Frequency Provider Last Rate Last Dose  . chlordiazePOXIDE (LIBRIUM) capsule 25 mg  25 mg Oral Q6H PRN Shuvon Rankin, NP   25 mg at 08/23/12 0602  . [COMPLETED] chlordiazePOXIDE (LIBRIUM) capsule 25 mg  25 mg  Oral QID Shuvon Rankin, NP   25 mg at 08/23/12 2151   Followed by  . chlordiazePOXIDE (LIBRIUM) capsule 25 mg  25 mg Oral TID Shuvon Rankin, NP   25 mg at 08/24/12 0810   Followed by  . chlordiazePOXIDE (LIBRIUM) capsule 25 mg  25 mg Oral BH-qamhs Shuvon Rankin, NP       Followed by  . chlordiazePOXIDE (LIBRIUM) capsule 25 mg  25 mg Oral Daily Shuvon Rankin, NP      . cyclobenzaprine (FLEXERIL) tablet 10 mg  10 mg Oral TID PRN Rachael Fee, MD   10 mg at 08/24/12 0903  . gabapentin (NEURONTIN) capsule 300 mg  300 mg Oral TID Rachael Fee, MD   300 mg at 08/24/12 1300  . hydrOXYzine (ATARAX/VISTARIL) tablet 25 mg  25 mg Oral Q6H PRN Shuvon Rankin, NP   25 mg at 08/22/12 2216  . ibuprofen (ADVIL,MOTRIN) tablet 800 mg  800 mg Oral TID PRN Cleotis Nipper, MD   800 mg at 08/23/12 2150  . loperamide (IMODIUM) capsule 2-4 mg  2-4 mg Oral PRN Shuvon Rankin, NP      . multivitamin with minerals tablet 1 tablet  1 tablet Oral Daily Shuvon Rankin, NP   1 tablet at 08/24/12 0810  . nicotine (NICODERM CQ - dosed in mg/24 hours) patch 21 mg  21 mg Transdermal Q0600 Rachael Fee, MD   21 mg at 08/24/12 [phone]  .  ondansetron (ZOFRAN-ODT) disintegrating tablet 4 mg  4 mg Oral Q6H PRN Shuvon Rankin, NP      . thiamine (VITAMIN B-1) tablet 100 mg  100 mg Oral Daily Shuvon Rankin, NP   100 mg at 08/24/12 0810  . traZODone (DESYREL) tablet 100 mg  100 mg Oral QHS PRN,MR X 1 Rachael Fee, MD      . [DISCONTINUED] traZODone (DESYREL) tablet 50 mg  50 mg Oral QHS PRN,MR X 1 Cleotis Nipper, MD   50 mg at 08/23/12 2328    Lab Results: No results found for this or any previous visit (from the past 48 hour(s)).  Physical Findings: AIMS: Facial and Oral Movements Muscles of Facial Expression: None, normal Lips and Perioral Area: None, normal Jaw: None, normal Tongue: None, normal,Extremity Movements Upper (arms, wrists, hands, fingers): None, normal Lower (legs, knees, ankles, toes): None, normal, Trunk  Movements Neck, shoulders, hips: None, normal, Overall Severity Severity of abnormal movements (highest score from questions above): None, normal Incapacitation due to abnormal movements: None, normal Patient's awareness of abnormal movements (rate only patient's report): No Awareness, Dental Status Current problems with teeth and/or dentures?: No Does patient usually wear dentures?: No  CIWA:  CIWA-Ar Total: 2  COWS:  COWS Total Score: 0   Treatment Plan Summary: Daily contact with patient to assess and evaluate symptoms and progress in treatment Medication management  Plan: Supportive approach/coping skills           Increase the Trazodone to 100 mg HS PRN sleep Rhonda Good 08/24/2012, 2:50 PM

## 2012-08-25 MED ORDER — TRAZODONE HCL 100 MG PO TABS: 100.0000 mg | ORAL_TABLET | Freq: Every evening | ORAL | Status: DC | PRN

## 2012-08-25 MED ORDER — IBUPROFEN 800 MG PO TABS: 800.0000 mg | ORAL_TABLET | Freq: Three times a day (TID) | ORAL | Status: DC | PRN

## 2012-08-25 MED ORDER — HYDROXYZINE HCL 25 MG PO TABS: 25.0000 mg | ORAL_TABLET | Freq: Four times a day (QID) | ORAL | Status: DC | PRN

## 2012-08-25 MED ORDER — GABAPENTIN 300 MG PO CAPS: 300.0000 mg | ORAL_CAPSULE | Freq: Three times a day (TID) | ORAL | Status: DC

## 2012-08-25 MED ORDER — CYCLOBENZAPRINE HCL 10 MG PO TABS: 10.0000 mg | ORAL_TABLET | Freq: Three times a day (TID) | ORAL | Status: DC | PRN

## 2012-08-25 NOTE — Progress Notes (Signed)
DISCHARGE NOTE: patient discharged from the hospital w/scripts, samples of drugs, instructions, followup appointment w/[facility], denies Si or HI, is pleasant and cooperative, family here to take her home, voicing no complaints at this time, enjoyed the groups here and interaction w/peers/staff. Happy to be leaving w/family.

## 2012-08-25 NOTE — Progress Notes (Signed)
Patient did attend the evening speaker AA meeting.  

## 2012-08-25 NOTE — Progress Notes (Signed)
[facility] Adult Inpatient Family/Significant Other Suicide Prevention Education  Suicide Prevention Education:  Education Completed; Sylvana Voller - grandmother [phone]),  (name of family member/significant other) has been identified by the patient as the family member/significant other with whom the patient will be residing, and identified as the person(s) who will aid the patient in the event of a mental health crisis (suicidal ideations/suicide attempt).  With written consent from the patient, the family member/significant other has been provided the following suicide prevention education, prior to the and/or following the discharge of the patient.  The suicide prevention education provided includes the following:  Suicide risk factors  Suicide prevention and interventions  National Suicide Hotline telephone number  [facility] assessment telephone number  [facility] Psychiatric Emergency Assistance 911  [facility] and/or Residential Mobile Crisis Unit telephone number  Request made of family/significant other to:  Remove weapons (e.g., guns, rifles, knives), all items previously/currently identified as safety concern.    Remove drugs/medications (over-the-counter, prescriptions, illicit drugs), all items previously/currently identified as a safety concern.  The family member/significant other verbalizes understanding of the suicide prevention education information provided.  The family member/significant other agrees to remove the items of safety concern listed above.  Carmina Miller 08/25/2012, 10:12 AM

## 2012-08-25 NOTE — Tx Team (Signed)
Interdisciplinary Treatment Plan Update (Adult)  Date:  08/25/2012  Time Reviewed:  10:13 AM   Progress in Treatment: Attending groups: Yes Participating in groups:  Yes Taking medication as prescribed: Yes Tolerating medication:  Yes Family/Significant othe contact made:  Yes Patient understands diagnosis:  Yes Discussing patient identified problems/goals with staff:  Yes Medical problems stabilized or resolved:  Yes Denies suicidal/homicidal ideation: Yes Issues/concerns per patient self-inventory:  None identified Other: N/A  New problem(s) identified: None Identified  Reason for Continuation of Hospitalization: Stable to d/c  Interventions implemented related to continuation of hospitalization: Stable to d/c  Additional comments: N/A  Estimated length of stay: D/C today  Discharge Plan: Pt will follow up with [facility] for medication management and therapy.    New goal(s): N/A  Review of initial/current patient goals per problem list:    1.  Goal(s): Address substance use by completing detox protocol  Met:  Yes  Target date: today   As evidenced by: detox no longer needed and pt is stable to d/c.    2.  Goal (s): Reduce depressive symptoms by reducing from a 10 to a 3  Met:  Yes  Target date: today  As evidenced by: Pt rates at a 0 today.     3.  Goal (s): Reduce anxiety symptoms by reducing from a 10 to a 3  Met:  Yes  Target date: today  As evidenced by: Pt rates at a 0 today.    4.  Goal(s): Eliminate SI  Met:  Yes  Target date: by discharge  As evidenced by: Pt denies SI.    Attendees: Patient:   08/25/2012 10:13 AM   Family:     Physician:  Geoffery Lyons, MD 08/25/2012 10:13 AM   Nursing: Alease Frame, RN 08/25/2012 10:13 AM   Clinical Social Worker:  Reyes Ivan, LCSWA 08/25/2012 10:13 AM   Other: Robbie Louis, RN 08/25/2012 10:13 AM   Other:  Nanine Means, NP 08/25/2012 10:16 AM   Other:     Other:     Other:      Scribe for  Treatment Team:   Reyes Ivan 08/25/2012 10:13 AM

## 2012-08-25 NOTE — Progress Notes (Signed)
Patient ID: Rhonda Good, female   DOB: 11/20/1990, 21 y.o.   MRN: [id number] D)  Has been out and about on hall, interacting with select peers, seems a Craine brighter this evening.  Attended group and came to med window rather late this evening after watching some TV after AA mtg was over.  Feels is learning a lot from the AA group and wants to get herself straight so that she can help others.  Pleasant and cooperative, thankful to be alive.  A)  Will continue to monitor q 15 minutes for safety.  R)  Safety maintained.

## 2012-08-25 NOTE — Discharge Summary (Signed)
Physician Discharge Summary Note  Patient:  Rhonda Good is an 21 y.o., female MRN:  [id number] DOB:  23-Sep-1991 Patient phone:  [phone] (home)  Patient address:   [address] [place] [address],   Date of Admission:  08/22/2012 Date of Discharge: 08/25/2012  Reason for Admission:  Patient had taken an overdose of blood pressure medication, depression, alcohol detox/dependency   Discharge Diagnoses: Active Problems:  Alcohol dependence  Substance induced mood disorder  Axis Diagnosis:  AXIS I:  Alcohol Abuse, Substance Abuse and Substance Induced Mood Disorder AXIS II:  Deferred AXIS III:   Past Medical History  Diagnosis Date  . Chronic back pain    AXIS IV:  economic problems, other psychosocial or environmental problems, problems related to social environment and problems with primary support group AXIS V:  61-70 mild symptoms  Level of Care:  OP  Hospital Course:   Patient attended individual and group therapy while inpatient along with attending AA/NA groups, one-one time with MD daily, medications for detox managed during inpatient, follow-up appointments made prior to discharge   Consults:  None  Significant Diagnostic Studies:  labs: Completed and reviewed, stable  Discharge Vitals:   Blood pressure 119/71, pulse 111, temperature 98.6 F (37 C), temperature source Oral, resp. rate 20, height 5\' 6"  (1.676 m), last menstrual period 08/08/2012, SpO2 98.00%. Lab Results:   No results found for this or any previous visit (from the past 72 hour(s)).  Physical Findings: AIMS: Facial and Oral Movements Muscles of Facial Expression: None, normal Lips and Perioral Area: None, normal Jaw: None, normal Tongue: None, normal,Extremity Movements Upper (arms, wrists, hands, fingers): None, normal Lower (legs, knees, ankles, toes): None, normal, Trunk Movements Neck, shoulders, hips: None, normal, Overall Severity Severity of abnormal movements (highest score from  questions above): None, normal Incapacitation due to abnormal movements: None, normal Patient's awareness of abnormal movements (rate only patient's report): No Awareness, Dental Status Current problems with teeth and/or dentures?: No Does patient usually wear dentures?: No  CIWA:  CIWA-Ar Total: 1  COWS:  COWS Total Score: 0   Mental Status Exam: See Mental Status Examination and Suicide Risk Assessment completed by Attending Physician prior to discharge.  Discharge destination:  [facility]  Is patient on multiple antipsychotic therapies at discharge:  No   Has Patient had three or more failed trials of antipsychotic monotherapy by history:  No Recommended Plan for Multiple Antipsychotic Therapies:  N/A  Discharge Orders    Future Orders Please Complete By Expires   Diet - low sodium heart healthy      Activity as tolerated - No restrictions          Medication List     As of 08/25/2012 10:39 AM    STOP taking these medications         chlordiazePOXIDE 25 MG capsule   Commonly known as: LIBRIUM      TAKE these medications      Indication    cyclobenzaprine 10 MG tablet   Commonly known as: FLEXERIL   Take 1 tablet (10 mg total) by mouth 3 (three) times daily as needed for muscle spasms.       gabapentin 300 MG capsule   Commonly known as: NEURONTIN   Take 1 capsule (300 mg total) by mouth 3 (three) times daily.    Indication: Alcohol Withdrawal Syndrome, Pain      hydrOXYzine 25 MG tablet   Commonly known as: ATARAX/VISTARIL   Take 1 tablet (  25 mg total) by mouth every 6 (six) hours as needed for anxiety (or CIWA score </= 10).       ibuprofen 800 MG tablet   Commonly known as: ADVIL,MOTRIN   Take 1 tablet (800 mg total) by mouth 3 (three) times daily as needed for pain.       traZODone 100 MG tablet   Commonly known as: DESYREL   Take 1 tablet (100 mg total) by mouth at bedtime as needed and may repeat dose one time if needed for sleep.    Indication:  Trouble Sleeping           Follow-up Information    Follow up with [facility]. (DO NOT D/C W/O APPT HERE!)    Contact information:   [address] [place], [place] [address] [phone]        Follow-up recommendations:  Activity as tolerated, low-sodium heart healthy diet   Comments:  Patient denied suicidal/homicidal ideations and auditory/visual hallucinations, follow-up appointments encouraged to attend, outside support groups encouraged and information given, Armanda will follow-up with [facility] in [place], patient given an ibuprofen Rx for back pain--stated she has had it and it does not cause any allergic reactions like naprosyn.  SignedNanine Means, PMH-NP 08/25/2012, 10:39 AM

## 2012-08-25 NOTE — [facility] Suicide Risk Assessment (Signed)
Suicide Risk Assessment  Discharge Assessment     Demographic Factors:  Adolescent or young adult, Caucasian and Unemployed  Mental Status Per Nursing Assessment::   On Admission:  NA  Current Mental Status by Physician: In full contact with reality. There are no suicidal ideas, plans, or intent. She is committed to abstinence. She is going to stay with her grandmother. She plans to go back to school. She is going to pursue outpatient follow up   Loss Factors: Decrease in vocational status  Historical Factors: NA  Risk Reduction Factors:   Sense of responsibility to family, Living with another person, especially a relative and Positive social support  Continued Clinical Symptoms:  Alcohol/Substance Abuse/Dependencies  Cognitive Features That Contribute To Risk: No evidence    Suicide Risk:  Minimal: No identifiable suicidal ideation.  Patients presenting with no risk factors but with morbid ruminations; may be classified as minimal risk based on the severity of the depressive symptoms  Discharge Diagnoses:   AXIS I:  Alcohol Dependence, Substance Induced Mood Disorder AXIS II:  Deferred AXIS III:   Past Medical History  Diagnosis Date  . Chronic back pain    AXIS IV:  Not currently AXIS V:  61-70 mild symptoms  Plan Of Care/Follow-up recommendations:  Activity:  As tolerated Diet:  Regular Follow up with [facility]/AA  Is patient on multiple antipsychotic therapies at discharge:  No   Has Patient had three or more failed trials of antipsychotic monotherapy by history:  No  Recommended Plan for Multiple Antipsychotic Therapies: N/A   Rhonda Good A 08/25/2012, 10:59 AM

## 2012-08-25 NOTE — Progress Notes (Signed)
[facility] Case Management Discharge Plan:  Will you be returning to the same living situation after discharge: Yes,  returning home At discharge, do you have transportation home?:Yes,  family picking pt up Do you have the ability to pay for your medications:Yes,  access to meds   Release of information consent forms completed and in the chart;  Patient's signature needed at discharge.  Patient to Follow up at:  Follow-up Information    Follow up with [facility]. On 08/27/2012. (Appointment scheduled at 7:45 am, referral # [id number])    Contact information:   [address] [place], [place] [address] [phone]         Patient denies SI/HI:   Yes,  denies SI/HI    Safety Planning and Suicide Prevention discussed:  Yes,  discussed with pt today  Barrier to discharge identified:No.  Summary and Recommendations: Pt attended discharge planning group and actively participated in group.  CSW provided pt with today's workbook.  Pt presents with calm mood and affect.  Pt denies having depression, anxiety and SI/HI.  Pt reports feelings stable to d/c.  No recommendations from CSW.  No further needs voiced by pt.  Pt stable to discharge.     Carmina Miller 08/25/2012, 11:32 AM

## 2012-08-26 NOTE — Progress Notes (Signed)
Patient Discharge Instructions:  After Visit Summary (AVS):   Faxed to:  08/26/12 Psychiatric Admission Assessment Note:   Faxed to:  08/26/12 Suicide Risk Assessment - Discharge Assessment:   Faxed to:  08/26/12 Faxed/Sent to the Next Level Care provider:  08/26/12 Faxed to [facility] @ [phone]  Jerelene Redden, 08/26/2012, 2:44 PM

## 2012-08-29 NOTE — Discharge Summary (Signed)
Agree with assessment and plan Tarez Bowns A. Randol Zumstein, M.D. 

## 2012-10-04 ENCOUNTER — Encounter (HOSPITAL_COMMUNITY): Payer: Self-pay | Admitting: *Deleted

## 2012-10-04 ENCOUNTER — Emergency Department (HOSPITAL_COMMUNITY): Payer: Self-pay

## 2012-10-04 ENCOUNTER — Emergency Department (HOSPITAL_COMMUNITY)
Admission: EM | Admit: 2012-10-04 | Discharge: 2012-10-04 | Disposition: A | Discharge status: Home / Self Care | Payer: Self-pay | Attending: Emergency Medicine | Admitting: Emergency Medicine

## 2012-10-04 DIAGNOSIS — G8929 Other chronic pain: Secondary | ICD-10-CM | POA: Insufficient documentation

## 2012-10-04 DIAGNOSIS — W19XXXA Unspecified fall, initial encounter: Secondary | ICD-10-CM | POA: Insufficient documentation

## 2012-10-04 DIAGNOSIS — T148XXA Other injury of unspecified body region, initial encounter: Secondary | ICD-10-CM

## 2012-10-04 DIAGNOSIS — Z79899 Other long term (current) drug therapy: Secondary | ICD-10-CM | POA: Insufficient documentation

## 2012-10-04 DIAGNOSIS — S5011XA Contusion of right forearm, initial encounter: Secondary | ICD-10-CM

## 2012-10-04 DIAGNOSIS — IMO0002 Reserved for concepts with insufficient information to code with codable children: Secondary | ICD-10-CM | POA: Insufficient documentation

## 2012-10-04 DIAGNOSIS — S5010XA Contusion of unspecified forearm, initial encounter: Secondary | ICD-10-CM | POA: Insufficient documentation

## 2012-10-04 DIAGNOSIS — F17200 Nicotine dependence, unspecified, uncomplicated: Secondary | ICD-10-CM | POA: Insufficient documentation

## 2012-10-04 DIAGNOSIS — Y939 Activity, unspecified: Secondary | ICD-10-CM | POA: Insufficient documentation

## 2012-10-04 DIAGNOSIS — Y92009 Unspecified place in unspecified non-institutional (private) residence as the place of occurrence of the external cause: Secondary | ICD-10-CM | POA: Insufficient documentation

## 2012-10-04 MED ORDER — OXYCODONE-ACETAMINOPHEN 5-325 MG PO TABS
1.0000 | ORAL_TABLET | Freq: Once | ORAL | Status: AC
  Administered 2012-10-04: 1 via ORAL
  Filled 2012-10-04: qty 1

## 2012-10-04 MED ORDER — TRAMADOL HCL 50 MG PO TABS: 50.0000 mg | ORAL_TABLET | Freq: Four times a day (QID) | ORAL | Status: DC | PRN

## 2012-10-04 NOTE — ED Notes (Signed)
Pt fell and hit her right forearm.

## 2012-10-04 NOTE — ED Notes (Signed)
Pt states fell hitting arm on concrete this evening. Abrasion noted to forearm, bleeding controlled. Small amount swelling and bruising noted at abrasion site. NAD noted. Pt denies LOC and other injuries.

## 2012-10-04 NOTE — ED Provider Notes (Signed)
History     CSN: [id number]  Arrival date & time 10/04/12  2001   First MD Initiated Contact with Patient 10/04/12 2047      Chief Complaint  Patient presents with  . Arm Pain    (Consider location/radiation/quality/duration/timing/severity/associated sxs/prior treatment) HPI Comments: Patient c/o pain and swelling to her right forearm that began several hrs PTA after a fall.  Pain is worse with movement and improves with rest.  She denies other injuries, numbness or weakness to the extremity, or proximal tenderness  Patient is a 22 y.o. female presenting with arm injury. The history is provided by the patient.  Arm Injury  The incident occurred just prior to arrival. The incident occurred at home. The injury mechanism was a fall. The wounds were self-inflicted. No protective equipment was used. There is an injury to the right forearm. The pain is moderate. It is unlikely that a foreign body is present. Pertinent negatives include no numbness, no visual disturbance, no nausea, no vomiting, no headaches, no neck pain, no pain when bearing weight, no focal weakness, no loss of consciousness, no tingling, no weakness, no cough and no difficulty breathing. There have been no prior injuries to these areas. She is right-handed. She has been behaving normally. There were no sick contacts. She has received no recent medical care.    Past Medical History  Diagnosis Date  . Chronic back pain     Past Surgical History  Procedure Date  . Mouth surgery   . Hand surgery     History reviewed. No pertinent family history.  History  Substance Use Topics  . Smoking status: Current Every Day Smoker -- 1.0 packs/day  . Smokeless tobacco: Not on file  . Alcohol Use: 0.0 oz/week    OB History    Grav Para Term Preterm Abortions TAB SAB Ect Mult Living                  Review of Systems  Constitutional: Negative for fever and chills.  HENT: Negative for neck pain.   Eyes: Negative for  visual disturbance.  Respiratory: Negative for cough.   Gastrointestinal: Negative for nausea and vomiting.  Genitourinary: Negative for dysuria and difficulty urinating.  Musculoskeletal: Positive for joint swelling and arthralgias.  Skin: Negative for color change and wound.  Neurological: Negative for tingling, focal weakness, loss of consciousness, weakness, numbness and headaches.  All other systems reviewed and are negative.    Allergies  Hydrocodone and Naproxen  Home Medications   Current Outpatient Rx  Name  Route  Sig  Dispense  Refill  . CITALOPRAM HYDROBROMIDE 20 MG PO TABS   Oral   Take 20 mg by mouth daily.         . CYCLOBENZAPRINE HCL 10 MG PO TABS   Oral   Take 1 tablet (10 mg total) by mouth 3 (three) times daily as needed for muscle spasms.   30 tablet   0   . GABAPENTIN 300 MG PO CAPS   Oral   Take 1 capsule (300 mg total) by mouth 3 (three) times daily.   90 capsule   0   . TRAZODONE HCL 100 MG PO TABS   Oral   Take 1 tablet (100 mg total) by mouth at bedtime as needed and may repeat dose one time if needed for sleep.   30 tablet   0   . TRAMADOL HCL 50 MG PO TABS   Oral   Take 1 tablet (  50 mg total) by mouth every 6 (six) hours as needed for pain.   20 tablet   0     BP 131/78  Pulse 73  Temp 98.6 F (37 C)  Resp 20  Ht 5\' 6"  (1.676 m)  Wt 200 lb (90.719 kg)  BMI 32.28 kg/m2  SpO2 97%  LMP 09/03/2012  Physical Exam  Nursing note and vitals reviewed. Constitutional: She is oriented to person, place, and time. She appears well-developed and well-nourished. No distress.  HENT:  Head: Normocephalic and atraumatic.  Cardiovascular: Normal rate, regular rhythm, normal heart sounds and intact distal pulses.   No murmur heard. Pulmonary/Chest: Effort normal and breath sounds normal.  Musculoskeletal: She exhibits tenderness. She exhibits no edema.       Right forearm: She exhibits tenderness and swelling. She exhibits no bony  tenderness, no deformity and no laceration.       Arms:      Localized ttp of the lateral right forearm, small area of mild STS and very superficial abrasion w/o bleeding.  Radial pulse is brisk, sensation intact.  CR< 2 sec.  No bruising or bony deformity.  Patient has full ROM.  Neurological: She is alert and oriented to person, place, and time. She exhibits normal muscle tone. Coordination normal.  Skin: Skin is warm and dry.    ED Course  Procedures (including critical care time)  Labs Reviewed - No data to display Dg Forearm Right  10/04/2012  *RADIOLOGY REPORT*  Clinical Data: Fall, forearm pain.  RIGHT FOREARM - 2 VIEW  Comparison: None  Findings: No acute bony abnormality.  Specifically, no fracture, subluxation, or dislocation.  Soft tissues are intact.  No joint effusion within the right elbow.  IMPRESSION: No acute bony abnormality.   Original Report Authenticated By: Charlett Nose, M.D.      1. Contusion of forearm, right   2. Abrasion       MDM   velcro wrist splint applied, pain improved, remains NV intact.  Abrasion was cleaned and bandaged by nursing.  Pt agrees to RICE therapy and f/u with Dr. Romeo Apple if needed    Prescribed: Ultram # 20    Domanique Luckett L. Rocklin Soderquist, PA 10/07/12 0127  Mckinna Demars L. Delila Kuklinski, [place] 10/07/12 [phone]

## 2012-10-16 NOTE — ED Provider Notes (Signed)
History/physical exam/procedure(s) were performed by non-physician practitioner and as supervising physician I was immediately available for consultation/collaboration. I have reviewed all notes and am in agreement with care and plan.   Opaline Reyburn S Karessa Onorato, MD 10/16/12 1045 

## 2012-10-31 ENCOUNTER — Other Ambulatory Visit (HOSPITAL_COMMUNITY): Payer: Self-pay | Admitting: Family Medicine

## 2012-11-05 ENCOUNTER — Encounter (HOSPITAL_COMMUNITY): Payer: Self-pay

## 2012-11-05 ENCOUNTER — Ambulatory Visit (HOSPITAL_COMMUNITY)
Admission: RE | Admit: 2012-11-05 | Discharge: 2012-11-05 | Disposition: A | Discharge status: Home / Self Care | Payer: Self-pay | Source: Ambulatory Visit | Attending: Family Medicine | Admitting: Family Medicine

## 2012-11-05 DIAGNOSIS — M5126 Other intervertebral disc displacement, lumbar region: Secondary | ICD-10-CM | POA: Insufficient documentation

## 2012-11-05 DIAGNOSIS — M545 Low back pain, unspecified: Secondary | ICD-10-CM | POA: Insufficient documentation

## 2012-11-13 ENCOUNTER — Emergency Department (HOSPITAL_COMMUNITY)
Admission: EM | Admit: 2012-11-13 | Discharge: 2012-11-13 | Disposition: A | Discharge status: Home / Self Care | Payer: Self-pay | Attending: Emergency Medicine | Admitting: Emergency Medicine

## 2012-11-13 ENCOUNTER — Encounter (HOSPITAL_COMMUNITY): Payer: Self-pay | Admitting: *Deleted

## 2012-11-13 DIAGNOSIS — T7492XA Unspecified child maltreatment, confirmed, initial encounter: Secondary | ICD-10-CM | POA: Insufficient documentation

## 2012-11-13 DIAGNOSIS — S0990XA Unspecified injury of head, initial encounter: Secondary | ICD-10-CM | POA: Insufficient documentation

## 2012-11-13 DIAGNOSIS — G8929 Other chronic pain: Secondary | ICD-10-CM | POA: Insufficient documentation

## 2012-11-13 DIAGNOSIS — F17200 Nicotine dependence, unspecified, uncomplicated: Secondary | ICD-10-CM | POA: Insufficient documentation

## 2012-11-13 DIAGNOSIS — M549 Dorsalgia, unspecified: Secondary | ICD-10-CM | POA: Insufficient documentation

## 2012-11-13 DIAGNOSIS — Z9889 Other specified postprocedural states: Secondary | ICD-10-CM | POA: Insufficient documentation

## 2012-11-13 DIAGNOSIS — IMO0002 Reserved for concepts with insufficient information to code with codable children: Secondary | ICD-10-CM | POA: Insufficient documentation

## 2012-11-13 DIAGNOSIS — Z79899 Other long term (current) drug therapy: Secondary | ICD-10-CM | POA: Insufficient documentation

## 2012-11-13 DIAGNOSIS — F1210 Cannabis abuse, uncomplicated: Secondary | ICD-10-CM | POA: Insufficient documentation

## 2012-11-13 DIAGNOSIS — S41119A Laceration without foreign body of unspecified upper arm, initial encounter: Secondary | ICD-10-CM

## 2012-11-13 DIAGNOSIS — S01502A Unspecified open wound of oral cavity, initial encounter: Secondary | ICD-10-CM | POA: Insufficient documentation

## 2012-11-13 DIAGNOSIS — S41109A Unspecified open wound of unspecified upper arm, initial encounter: Secondary | ICD-10-CM | POA: Insufficient documentation

## 2012-11-13 DIAGNOSIS — T7491XA Unspecified adult maltreatment, confirmed, initial encounter: Secondary | ICD-10-CM | POA: Insufficient documentation

## 2012-11-13 MED ORDER — ACETAMINOPHEN 325 MG PO TABS
650.0000 mg | ORAL_TABLET | Freq: Once | ORAL | Status: AC
  Administered 2012-11-13: 650 mg via ORAL

## 2012-11-13 MED ORDER — LIDOCAINE HCL (PF) 2 % IJ SOLN
INTRAMUSCULAR | Status: AC
  Administered 2012-11-13: 06:00:00
  Filled 2012-11-13: qty 10

## 2012-11-13 MED ORDER — ACETAMINOPHEN 325 MG PO TABS
ORAL_TABLET | ORAL | Status: AC
  Administered 2012-11-13: 650 mg via ORAL
  Filled 2012-11-13: qty 2

## 2012-11-13 NOTE — ED Notes (Signed)
Wound dressed with clean gauze.

## 2012-11-13 NOTE — ED Notes (Signed)
Pt angry that physician will not order Percocet.  Pt also very angry that department will not provide cab voucher for transportation home.  Multiple phone calls received from family members since pt's arrival.  Reinforced with pt that friends or family can provide transportation home upon discharge.

## 2012-11-13 NOTE — ED Notes (Signed)
Pt yelling and demanding pain medication.  Explained to pt that medication can not be ordered or given till after the physician sees her.

## 2012-11-13 NOTE — ED Notes (Signed)
EDP at bedside to evaluate and suture laceration.

## 2012-11-13 NOTE — ED Notes (Addendum)
Pt to department via EMS.  Reports she fell through glass pane of door.  Laceration to left forearm.  Bleeding controlled at this time.  Pt reports drinking "a six pack" tonight. Unknown when last tetanus shot was.
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
## 2012-11-16 ENCOUNTER — Emergency Department (HOSPITAL_COMMUNITY)
Admission: EM | Admit: 2012-11-16 | Discharge: 2012-11-16 | Disposition: A | Discharge status: Home / Self Care | Payer: Self-pay | Attending: Emergency Medicine | Admitting: Emergency Medicine

## 2012-11-16 ENCOUNTER — Encounter (HOSPITAL_COMMUNITY): Payer: Self-pay | Admitting: Emergency Medicine

## 2012-11-16 DIAGNOSIS — G8929 Other chronic pain: Secondary | ICD-10-CM | POA: Insufficient documentation

## 2012-11-16 DIAGNOSIS — Z5189 Encounter for other specified aftercare: Secondary | ICD-10-CM

## 2012-11-16 DIAGNOSIS — Z4800 Encounter for change or removal of nonsurgical wound dressing: Secondary | ICD-10-CM | POA: Insufficient documentation

## 2012-11-16 DIAGNOSIS — M549 Dorsalgia, unspecified: Secondary | ICD-10-CM | POA: Insufficient documentation

## 2012-11-16 DIAGNOSIS — Z79899 Other long term (current) drug therapy: Secondary | ICD-10-CM | POA: Insufficient documentation

## 2012-11-16 DIAGNOSIS — F17200 Nicotine dependence, unspecified, uncomplicated: Secondary | ICD-10-CM | POA: Insufficient documentation

## 2012-11-16 MED ORDER — SULFAMETHOXAZOLE-TRIMETHOPRIM 800-160 MG PO TABS: 1.0000 | ORAL_TABLET | Freq: Two times a day (BID) | ORAL | Status: DC

## 2012-11-16 MED ORDER — OXYCODONE-ACETAMINOPHEN 5-325 MG PO TABS
1.0000 | ORAL_TABLET | Freq: Once | ORAL | Status: AC
  Administered 2012-11-16: 1 via ORAL
  Filled 2012-11-16: qty 1

## 2012-11-16 NOTE — ED Provider Notes (Signed)
Medical screening examination/treatment/procedure(s) were performed by non-physician practitioner and as supervising physician I was immediately available for consultation/collaboration.   Canesha Tesfaye L Kristion Holifield, MD 11/16/12 2240 

## 2012-11-16 NOTE — ED Notes (Signed)
Pt presents with staples to left forearm which were placed on Thursday morning. Pt is here to see if they are ready to be removed.
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
## 2012-11-20 ENCOUNTER — Emergency Department (HOSPITAL_COMMUNITY)
Admission: EM | Admit: 2012-11-20 | Discharge: 2012-11-20 | Disposition: A | Discharge status: Home / Self Care | Payer: Self-pay | Attending: Emergency Medicine | Admitting: Emergency Medicine

## 2012-11-20 ENCOUNTER — Encounter (HOSPITAL_COMMUNITY): Payer: Self-pay | Admitting: *Deleted

## 2012-11-20 DIAGNOSIS — Z4802 Encounter for removal of sutures: Secondary | ICD-10-CM | POA: Insufficient documentation

## 2012-11-20 DIAGNOSIS — G8929 Other chronic pain: Secondary | ICD-10-CM | POA: Insufficient documentation

## 2012-11-20 DIAGNOSIS — F17200 Nicotine dependence, unspecified, uncomplicated: Secondary | ICD-10-CM | POA: Insufficient documentation

## 2012-11-20 DIAGNOSIS — M549 Dorsalgia, unspecified: Secondary | ICD-10-CM | POA: Insufficient documentation

## 2012-11-20 DIAGNOSIS — Z9889 Other specified postprocedural states: Secondary | ICD-10-CM | POA: Insufficient documentation

## 2012-11-20 MED ORDER — TRAMADOL HCL 50 MG PO TABS
50.0000 mg | ORAL_TABLET | Freq: Once | ORAL | Status: AC
  Administered 2012-11-20: 50 mg via ORAL
  Filled 2012-11-20: qty 1

## 2012-11-20 NOTE — ED Notes (Signed)
Pt presents to ed for staple removal. Pt's forearm is red around staples. 6 staples removed, steri strips applied. Edges approximated with a slight opening at distal cut. NAD noted pt tolerated well

## 2012-11-20 NOTE — ED Notes (Signed)
Staples to lt forearm , wound is red .

## 2012-11-20 NOTE — ED Provider Notes (Signed)
History     CSN: [id number]  Arrival date & time 11/20/12  1531   First MD Initiated Contact with Patient 11/20/12 1710      Chief Complaint  Patient presents with  . Wound Check    (Consider location/radiation/quality/duration/timing/severity/associated sxs/prior treatment) Patient is a 22 y.o. female presenting with suture removal. The history is provided by the patient.  Suture / Staple Removal  The sutures were placed 7 to 10 days ago. Treatments since wound repair include oral antibiotics, regular soap and water washings and a wound recheck. Fever duration: no fever. There has been no drainage from the wound. The redness has improved. There is no swelling present. The pain has improved. She has no difficulty moving the affected extremity or digit.    Past Medical History  Diagnosis Date  . Chronic back pain     Past Surgical History  Procedure Laterality Date  . Mouth surgery    . Hand surgery      History reviewed. No pertinent family history.  History  Substance Use Topics  . Smoking status: Current Every Day Smoker -- 1.00 packs/day  . Smokeless tobacco: Not on file  . Alcohol Use: 0.0 oz/week    OB History   Grav Para Term Preterm Abortions TAB SAB Ect Mult Living                  Review of Systems  Constitutional: Negative for fever and chills.  Musculoskeletal: Negative for back pain, joint swelling and arthralgias.  Skin: Positive for wound.       Laceration   Neurological: Negative for dizziness, weakness and numbness.  Hematological: Does not bruise/bleed easily.  All other systems reviewed and are negative.    Allergies  Hydrocodone and Naproxen  Home Medications   Current Outpatient Rx  Name  Route  Sig  Dispense  Refill  . acetaminophen (TYLENOL) 500 MG tablet   Oral   Take 2,000 mg by mouth once as needed for pain.         . citalopram (CELEXA) 20 MG tablet   Oral   Take 20 mg by mouth daily.         . cyclobenzaprine  (FLEXERIL) 10 MG tablet   Oral   Take 1 tablet (10 mg total) by mouth 3 (three) times daily as needed for muscle spasms.   30 tablet   0   . gabapentin (NEURONTIN) 300 MG capsule   Oral   Take 1 capsule (300 mg total) by mouth 3 (three) times daily.   90 capsule   0   . sulfamethoxazole-trimethoprim (SEPTRA DS) 800-160 MG per tablet   Oral   Take 1 tablet by mouth every 12 (twelve) hours.   20 tablet   0   . traZODone (DESYREL) 100 MG tablet   Oral   Take 1 tablet (100 mg total) by mouth at bedtime as needed and may repeat dose one time if needed for sleep.   30 tablet   0     BP 111/73  Pulse 94  Temp(Src) 97.9 F (36.6 C) (Oral)  Resp 18  Ht 5\' 6"  (1.676 m)  Wt 200 lb (90.719 kg)  BMI 32.3 kg/m2  SpO2 97%  LMP 11/05/2012  Physical Exam  Nursing note and vitals reviewed. Constitutional: She is oriented to person, place, and time. She appears well-developed and well-nourished. No distress.  HENT:  Head: Normocephalic and atraumatic.  Musculoskeletal: Normal range of motion. She exhibits tenderness. She  exhibits no edema.       Left forearm: She exhibits tenderness and laceration. She exhibits no swelling, no edema and no deformity.       Arms: Laceration to the lef forearm with staples in place.  Mild surrounding erythema w/o drainage, induration, excessive warmth or edema. Distal sensation intact, radial pulse brisk, pt has FROM of the left wrist and fingers.  Neurological: She is alert and oriented to person, place, and time. She exhibits normal muscle tone. Coordination normal.  Skin: Skin is warm and dry.  See MS exam    ED Course  Procedures (including critical care time)  Labs Reviewed - No data to display No results found.      MDM  Previous ED charts reviewed.    Staples to the left forearm.  Mild surrounding erythema along the incision site.  Pt has not gotten antibiotics filled from previous visit.  Advised to get medication filled today.     Laceration appears to be healing.  NV intact.  Staples removed by nursing staff w/o difficulty.  Steri-strips applied.        Kenyette Gundy L. Ethyle Tiedt, [place] 11/23/12 0101

## 2012-11-23 NOTE — ED Provider Notes (Signed)
Medical screening examination/treatment/procedure(s) were performed by non-physician practitioner and as supervising physician I was immediately available for consultation/collaboration.   Dorotha Hirschi W. Elainna Eshleman, MD 11/23/12 1043 

## 2013-01-28 ENCOUNTER — Emergency Department (HOSPITAL_COMMUNITY)
Admission: EM | Admit: 2013-01-28 | Discharge: 2013-01-28 | Discharge status: Against Medical Advice | Payer: Self-pay | Attending: Emergency Medicine | Admitting: Emergency Medicine

## 2013-01-28 ENCOUNTER — Emergency Department (HOSPITAL_COMMUNITY): Payer: Self-pay

## 2013-01-28 DIAGNOSIS — F17200 Nicotine dependence, unspecified, uncomplicated: Secondary | ICD-10-CM | POA: Insufficient documentation

## 2013-01-28 DIAGNOSIS — F10929 Alcohol use, unspecified with intoxication, unspecified: Secondary | ICD-10-CM

## 2013-01-28 DIAGNOSIS — S0003XA Contusion of scalp, initial encounter: Secondary | ICD-10-CM | POA: Insufficient documentation

## 2013-01-28 DIAGNOSIS — Z8739 Personal history of other diseases of the musculoskeletal system and connective tissue: Secondary | ICD-10-CM | POA: Insufficient documentation

## 2013-01-28 DIAGNOSIS — Z3202 Encounter for pregnancy test, result negative: Secondary | ICD-10-CM | POA: Insufficient documentation

## 2013-01-28 DIAGNOSIS — S0993XA Unspecified injury of face, initial encounter: Secondary | ICD-10-CM | POA: Insufficient documentation

## 2013-01-28 DIAGNOSIS — S0990XA Unspecified injury of head, initial encounter: Secondary | ICD-10-CM | POA: Insufficient documentation

## 2013-01-28 DIAGNOSIS — F1010 Alcohol abuse, uncomplicated: Secondary | ICD-10-CM | POA: Insufficient documentation

## 2013-01-28 DIAGNOSIS — Z79899 Other long term (current) drug therapy: Secondary | ICD-10-CM | POA: Insufficient documentation

## 2013-01-28 LAB — COMPREHENSIVE METABOLIC PANEL
ALT: 32 U/L (ref 0–35)
Alkaline Phosphatase: 94 U/L (ref 39–117)
BUN: 4 mg/dL — ABNORMAL LOW (ref 6–23)
CO2: 22 mEq/L (ref 19–32)
GFR calc Af Amer: >90 mL/min (ref >90)
GFR calc non Af Amer: >90 mL/min (ref >90)
Glucose, Bld: 87 mg/dL (ref 70–99)
Potassium: 3.5 mEq/L (ref 3.5–5.1)
Sodium: 135 mEq/L (ref 135–145)
Total Bilirubin: 0.3 mg/dL (ref 0.3–1.2)
Total Protein: 7.6 g/dL (ref 6.0–8.3)

## 2013-01-28 LAB — ETHANOL: Alcohol, Ethyl (B): 198 mg/dL — ABNORMAL HIGH (ref 0–11)

## 2013-01-28 LAB — URINALYSIS, ROUTINE W REFLEX MICROSCOPIC
Bilirubin Urine: NEGATIVE
Glucose, UA: NEGATIVE mg/dL
Hgb urine dipstick: NEGATIVE
Ketones, ur: NEGATIVE mg/dL
Protein, ur: NEGATIVE mg/dL
Urobilinogen, UA: 0.2 mg/dL (ref 0.0–1.0)

## 2013-01-28 LAB — CBC WITH DIFFERENTIAL/PLATELET
Eosinophils Absolute: 0.1 10*3/uL (ref 0.0–0.7)
Hemoglobin: 13.8 g/dL (ref 12.0–15.0)
Lymphocytes Relative: 26 % (ref 12–46)
Lymphs Abs: 3.2 10*3/uL (ref 0.7–4.0)
MCH: 29.2 pg (ref 26.0–34.0)
MCV: 86.7 fL (ref 78.0–100.0)
Monocytes Relative: 6 % (ref 3–12)
Neutrophils Relative %: 67 % (ref 43–77)
RBC: 4.73 MIL/uL (ref 3.87–5.11)
WBC: 12.4 10*3/uL — ABNORMAL HIGH (ref 4.0–10.5)

## 2013-01-28 NOTE — ED Notes (Signed)
Pt demanding to have something to eat.  Explained to pt that as soon as radiology results came back, provided they were clear, we would be able to get her something.  Pt demanding to leave.  Reinforced with pt importance of waiting till results were back and she was declared medically stable and discharged.  EDP aware.
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
## 2013-01-28 NOTE — ED Notes (Addendum)
Pt left department, insisting "there aint nothing wrong with me."  AMA form signed.
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
## 2013-01-28 NOTE — ED Notes (Addendum)
Patient stating she wants to leave. De.rancour notified. He states she cannot leave. [place] at side

## 2013-01-28 NOTE — ED Notes (Addendum)
Patient up off stretcher and put her clothes back on.  Requesting something to eat. Advised of need to wait in room on stretcher and will supply something to eat asap after we get results of her xrays, CT.    Patient also using telephone and attempting to place calls

## 2013-02-12 ENCOUNTER — Emergency Department (HOSPITAL_COMMUNITY): Payer: Self-pay

## 2013-02-12 ENCOUNTER — Encounter (HOSPITAL_COMMUNITY): Payer: Self-pay | Admitting: Emergency Medicine

## 2013-02-12 ENCOUNTER — Emergency Department (HOSPITAL_COMMUNITY)
Admission: EM | Admit: 2013-02-12 | Discharge: 2013-02-12 | Disposition: A | Discharge status: Home / Self Care | Payer: Self-pay | Attending: Emergency Medicine | Admitting: Emergency Medicine

## 2013-02-12 DIAGNOSIS — G8929 Other chronic pain: Secondary | ICD-10-CM | POA: Insufficient documentation

## 2013-02-12 DIAGNOSIS — F17200 Nicotine dependence, unspecified, uncomplicated: Secondary | ICD-10-CM | POA: Insufficient documentation

## 2013-02-12 DIAGNOSIS — S71009A Unspecified open wound, unspecified hip, initial encounter: Secondary | ICD-10-CM | POA: Insufficient documentation

## 2013-02-12 DIAGNOSIS — T07XXXA Unspecified multiple injuries, initial encounter: Secondary | ICD-10-CM

## 2013-02-12 DIAGNOSIS — Z9889 Other specified postprocedural states: Secondary | ICD-10-CM | POA: Insufficient documentation

## 2013-02-12 DIAGNOSIS — S61509A Unspecified open wound of unspecified wrist, initial encounter: Secondary | ICD-10-CM | POA: Insufficient documentation

## 2013-02-12 DIAGNOSIS — S8000XA Contusion of unspecified knee, initial encounter: Secondary | ICD-10-CM | POA: Insufficient documentation

## 2013-02-12 DIAGNOSIS — S5010XA Contusion of unspecified forearm, initial encounter: Secondary | ICD-10-CM | POA: Insufficient documentation

## 2013-02-12 DIAGNOSIS — M549 Dorsalgia, unspecified: Secondary | ICD-10-CM | POA: Insufficient documentation

## 2013-02-12 DIAGNOSIS — R0602 Shortness of breath: Secondary | ICD-10-CM | POA: Insufficient documentation

## 2013-02-12 DIAGNOSIS — S301XXA Contusion of abdominal wall, initial encounter: Secondary | ICD-10-CM | POA: Insufficient documentation

## 2013-02-12 DIAGNOSIS — S61409A Unspecified open wound of unspecified hand, initial encounter: Secondary | ICD-10-CM | POA: Insufficient documentation

## 2013-02-12 DIAGNOSIS — S1093XA Contusion of unspecified part of neck, initial encounter: Secondary | ICD-10-CM | POA: Insufficient documentation

## 2013-02-12 DIAGNOSIS — S20219A Contusion of unspecified front wall of thorax, initial encounter: Secondary | ICD-10-CM | POA: Insufficient documentation

## 2013-02-12 DIAGNOSIS — S71109A Unspecified open wound, unspecified thigh, initial encounter: Secondary | ICD-10-CM | POA: Insufficient documentation

## 2013-02-12 DIAGNOSIS — S0003XA Contusion of scalp, initial encounter: Secondary | ICD-10-CM | POA: Insufficient documentation

## 2013-02-12 MED ORDER — OXYCODONE-ACETAMINOPHEN 5-325 MG PO TABS
2.0000 | ORAL_TABLET | Freq: Once | ORAL | Status: AC
  Administered 2013-02-12: 2 via ORAL
  Filled 2013-02-12: qty 2

## 2013-02-12 MED ORDER — OXYCODONE-ACETAMINOPHEN 5-325 MG PO TABS: 1.0000 | ORAL_TABLET | ORAL | Status: DC | PRN

## 2013-02-12 MED ORDER — OXYCODONE-ACETAMINOPHEN 5-325 MG PO TABS: 1.0000 | ORAL_TABLET | Freq: Once | ORAL | Status: DC

## 2013-02-12 MED ORDER — LIDOCAINE HCL (PF) 2 % IJ SOLN
10.0000 mL | Freq: Once | INTRAMUSCULAR | Status: AC
  Administered 2013-02-12: 10 mL
  Filled 2013-02-12: qty 10

## 2013-02-12 MED ORDER — OXYCODONE-ACETAMINOPHEN 5-325 MG PO TABS: 2.0000 | ORAL_TABLET | Freq: Once | ORAL | Status: DC

## 2013-02-12 NOTE — ED Provider Notes (Signed)
LACERATION REPAIRS LEFT WRIST AND HAND. Patient identified by arm band. Permission for the procedure given by the patient. Procedural time out taken before repair of laceration of the left wrist.  The procedure was explained to the patient in terms which he understood. The laceration to the left wrist was painted with Betadine, draped in sterile fashion, and infiltrated with 2% plain lidocaine. The wound was then cleansed with a Betadine surgical brush, irrigated with saline. The wound was repaired with 2 interrupted sutures of 4-0 Prolene. With loose wound edge approximation. The wound measures 1.5 cm.  Laceration to the palmar surface of the right hand was also painted with Betadine, infiltrated with 2% plain lidocaine. The wound was cleansed with a Betadine surgical brush, irrigated with sterile saline. The wound was draped in a sterile fashion and repaired with 2 interrupted sutures of 4-0 Prolene. The wound measures 2 cm.  LACERATION REPAIR OF THE RIGHT THIGH. There are 2 lacerations to the right thigh. Permission for the procedure is again given by the patient. Procedural time out was taken before repair of lacerations to the right thigh.  The medial laceration was painted with Betadine, and infiltrated with 2% plain lidocaine. It was cleansed with a Betadine scrub brush. Was irrigated with saline. Using sterile draping and technique, the wound was evaluated and there was no foreign body present. The wound was closed loosely with 4 staples. The wound measures 2.5 cm.  The more lateral laceration was painted with Betadine and infiltrated with 2% plain lidocaine. It was cleansed with a Betadine scrub brush and irrigated with sterile saline. The patient was draped in sterile fashion and the wound was repaired with 5 staples, with loose wound edge approximation. The wound measures 4 cm.  The patient tolerated the procedure without problem. Sterile dressing was applied.  Kathie Dike,  PA-C 02/12/13 1830   Medical screening examination/treatment/procedure(s) were conducted as a shared visit with non-physician practitioner(s) and myself.  I personally evaluated the patient during the encounter  Flint Melter, MD 02/12/13 [phone]

## 2013-02-12 NOTE — ED Notes (Signed)
Patient given a sprite at this time. 
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
## 2013-02-20 ENCOUNTER — Emergency Department (HOSPITAL_COMMUNITY)
Admission: EM | Admit: 2013-02-20 | Discharge: 2013-02-20 | Disposition: A | Discharge status: Home / Self Care | Payer: Self-pay | Attending: Emergency Medicine | Admitting: Emergency Medicine

## 2013-02-20 ENCOUNTER — Encounter (HOSPITAL_COMMUNITY): Payer: Self-pay | Admitting: *Deleted

## 2013-02-20 DIAGNOSIS — M549 Dorsalgia, unspecified: Secondary | ICD-10-CM | POA: Insufficient documentation

## 2013-02-20 DIAGNOSIS — Z4802 Encounter for removal of sutures: Secondary | ICD-10-CM

## 2013-02-20 DIAGNOSIS — Z4801 Encounter for change or removal of surgical wound dressing: Secondary | ICD-10-CM | POA: Insufficient documentation

## 2013-02-20 DIAGNOSIS — R000 Tachycardia, unspecified: Secondary | ICD-10-CM | POA: Insufficient documentation

## 2013-02-20 DIAGNOSIS — F17200 Nicotine dependence, unspecified, uncomplicated: Secondary | ICD-10-CM | POA: Insufficient documentation

## 2013-02-20 DIAGNOSIS — G8929 Other chronic pain: Secondary | ICD-10-CM | POA: Insufficient documentation

## 2013-02-20 MED ORDER — BACITRACIN ZINC 500 UNIT/GM EX OINT
TOPICAL_OINTMENT | CUTANEOUS | Status: AC
  Filled 2013-02-20: qty 0.9
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
## 2013-02-20 NOTE — ED Notes (Signed)
Here for suture and staple removal from rt thigh and lt hand

## 2013-02-20 NOTE — ED Notes (Signed)
Dc instructions reviewed with pt and bacitracin given to pt per [facility]. NP.

## 2013-02-23 MED FILL — Oxycodone w/ Acetaminophen Tab 5-325 MG: ORAL | Qty: 6 | Status: AC

## 2013-02-25 NOTE — ED Provider Notes (Signed)
Medical screening examination/treatment/procedure(s) were performed by non-physician practitioner and as supervising physician I was immediately available for consultation/collaboration.   Shelda Jakes, MD 02/25/13 [phone]

## 2013-04-03 ENCOUNTER — Emergency Department (HOSPITAL_COMMUNITY)
Admission: EM | Admit: 2013-04-03 | Discharge: 2013-04-03 | Disposition: A | Discharge status: Home / Self Care | Payer: Self-pay | Attending: Emergency Medicine | Admitting: Emergency Medicine

## 2013-04-03 ENCOUNTER — Emergency Department (HOSPITAL_COMMUNITY): Payer: Self-pay

## 2013-04-03 ENCOUNTER — Encounter (HOSPITAL_COMMUNITY): Payer: Self-pay | Admitting: *Deleted

## 2013-04-03 DIAGNOSIS — F17200 Nicotine dependence, unspecified, uncomplicated: Secondary | ICD-10-CM | POA: Insufficient documentation

## 2013-04-03 DIAGNOSIS — N83209 Unspecified ovarian cyst, unspecified side: Secondary | ICD-10-CM | POA: Insufficient documentation

## 2013-04-03 DIAGNOSIS — G8929 Other chronic pain: Secondary | ICD-10-CM | POA: Insufficient documentation

## 2013-04-03 DIAGNOSIS — N83201 Unspecified ovarian cyst, right side: Secondary | ICD-10-CM

## 2013-04-03 DIAGNOSIS — Z3202 Encounter for pregnancy test, result negative: Secondary | ICD-10-CM | POA: Insufficient documentation

## 2013-04-03 DIAGNOSIS — R300 Dysuria: Secondary | ICD-10-CM | POA: Insufficient documentation

## 2013-04-03 DIAGNOSIS — R109 Unspecified abdominal pain: Secondary | ICD-10-CM | POA: Insufficient documentation

## 2013-04-03 DIAGNOSIS — N938 Other specified abnormal uterine and vaginal bleeding: Secondary | ICD-10-CM | POA: Insufficient documentation

## 2013-04-03 DIAGNOSIS — N949 Unspecified condition associated with female genital organs and menstrual cycle: Secondary | ICD-10-CM | POA: Insufficient documentation

## 2013-04-03 LAB — BASIC METABOLIC PANEL
Calcium: 9.3 mg/dL (ref 8.4–10.5)
GFR calc Af Amer: >90 mL/min (ref >90)
GFR calc non Af Amer: >90 mL/min (ref >90)
Glucose, Bld: 111 mg/dL — ABNORMAL HIGH (ref 70–99)
Potassium: 4.3 mEq/L (ref 3.5–5.1)
Sodium: 136 mEq/L (ref 135–145)

## 2013-04-03 LAB — CBC WITH DIFFERENTIAL/PLATELET
Basophils Relative: 0 % (ref 0–1)
Eosinophils Absolute: 0.1 10*3/uL (ref 0.0–0.7)
Eosinophils Relative: 2 % (ref 0–5)
Lymphs Abs: 1.6 10*3/uL (ref 0.7–4.0)
MCH: 29.8 pg (ref 26.0–34.0)
MCHC: 34.1 g/dL (ref 30.0–36.0)
MCV: 87.3 fL (ref 78.0–100.0)
Neutrophils Relative %: 72 % (ref 43–77)
Platelets: 224 10*3/uL (ref 150–400)
RDW: 17.2 % — ABNORMAL HIGH (ref 11.5–15.5)

## 2013-04-03 LAB — URINALYSIS, ROUTINE W REFLEX MICROSCOPIC
Bilirubin Urine: NEGATIVE
Ketones, ur: NEGATIVE mg/dL
Nitrite: NEGATIVE
Urobilinogen, UA: 0.2 mg/dL (ref 0.0–1.0)
pH: 6 (ref 5.0–8.0)

## 2013-04-03 LAB — WET PREP, GENITAL

## 2013-04-03 LAB — URINE MICROSCOPIC-ADD ON

## 2013-04-03 MED ORDER — OXYCODONE-ACETAMINOPHEN 5-325 MG PO TABS: 2.0000 | ORAL_TABLET | ORAL | Status: DC | PRN

## 2013-04-03 MED ORDER — MEGESTROL ACETATE 20 MG PO TABS: 20.0000 mg | ORAL_TABLET | Freq: Every day | ORAL | Status: DC

## 2013-04-03 MED ORDER — OXYCODONE-ACETAMINOPHEN 5-325 MG PO TABS
2.0000 | ORAL_TABLET | Freq: Once | ORAL | Status: AC
  Administered 2013-04-03: 2 via ORAL
  Filled 2013-04-03: qty 2

## 2013-04-03 NOTE — ED Provider Notes (Signed)
History    This chart was scribed for Donnetta Hutching, MD, by Frederik Pear, ED scribe. The patient was seen in room APA06/APA06 and the patient's care was started at 1509.   CSN: [id number] Arrival date & time 04/03/13  1253  First MD Initiated Contact with Patient 04/03/13 1509     Chief Complaint  Patient presents with  . Abdominal Pain  . Vaginal Bleeding   (Consider location/radiation/quality/duration/timing/severity/associated sxs/prior Treatment) The history is provided by the patient and medical records. No language interpreter was used.    HPI Comments: Rhonda Good is a 22 y.o. female who presents to the Emergency Department complaining of waxing and waning vaginal bleeding that began 1 month ago and has persisted every day. She states that her period typically begins on the 11th of the month, but after her period began last month it never stopped. She denies having seen a provider for this symptom until today. She reports that most days she has used 5 daily when the symptom has been minimal and more pads during episodes of increased bleeding. She also complains of weakness, dysuria, and suprapubic discomfort. She denies vaginal discharge. She reports she is currently sexually active with her husband and is not taking BC as she is actively trying to get pregnant. She is currently unemployed, but reports she is actively seeking work. She is a current, every day smoker with occasional ETOH use. Allergic to hydrocodone and naproxen.   No PCP.  Past Medical History  Diagnosis Date  . Chronic back pain    Past Surgical History  Procedure Laterality Date  . Mouth surgery    . Hand surgery     No family history on file. History  Substance Use Topics  . Smoking status: Current Every Day Smoker -- 1.00 packs/day    Types: Cigarettes  . Smokeless tobacco: Not on file  . Alcohol Use: 0.0 oz/week   OB History   Grav Para Term Preterm Abortions TAB SAB Ect Mult Living                  Review of Systems A complete 10 system review of systems was obtained and all systems are negative except as noted in the HPI and PMH.  Allergies  Hydrocodone and Naproxen  Home Medications   Current Outpatient Rx  Name  Route  Sig  Dispense  Refill  . acetaminophen (TYLENOL) 500 MG tablet   Oral   Take 1,000 mg by mouth once as needed for pain.          . Multiple Vitamins-Minerals (MULTIVITAMIN WITH MINERALS) tablet   Oral   Take 1 tablet by mouth daily.          BP 130/89  Pulse 84  Temp(Src) 97.4 F (36.3 C) (Oral)  Resp 16  Ht 5\' 6"  (1.676 m)  Wt 200 lb (90.719 kg)  BMI 32.3 kg/m2  SpO2 99%  LMP 02/01/2013 Physical Exam  Nursing note and vitals reviewed. Constitutional: She is oriented to person, place, and time. She appears well-developed and well-nourished.  HENT:  Head: Normocephalic and atraumatic.  Eyes: Conjunctivae and EOM are normal. Pupils are equal, round, and reactive to light.  Neck: Normal range of motion. Neck supple.  Cardiovascular: Normal rate, regular rhythm and normal heart sounds.   Pulmonary/Chest: Effort normal and breath sounds normal.  Abdominal: Soft. Bowel sounds are normal. There is tenderness.  Suprapubic tenderness.   Genitourinary:  Very slight amount of bleeding from the  cervical os.  No cervical motion tenderness. No adnexal tenderness or masses. Uterus nontender  Musculoskeletal: Normal range of motion.  Neurological: She is alert and oriented to person, place, and time.  Skin: Skin is warm and dry.  Psychiatric: She has a normal mood and affect.    ED Course  Procedures (including critical care time)  DIAGNOSTIC STUDIES: Oxygen Saturation is 99% on room air, normal by my interpretation.    COORDINATION OF CARE:  15:05- Discussed planned course of treatment with the patient, including a wet prep and GC/chlamydia probe, UA, pregnancy test, and blood work, who is agreeable at this time.  16:31- Ordered  transvaginal US.  Results for orders placed during the hospital encounter of 04/03/13  URINALYSIS, ROUTINE W REFLEX MICROSCOPIC      Result Value Range   Color, Urine ORANGE (*) YELLOW   APPearance CLOUDY (*) CLEAR   Specific Gravity, Urine 1.025  1.005 - 1.030   pH 6.0  5.0 - 8.0   Glucose, UA NEGATIVE  NEGATIVE mg/dL   Hgb urine dipstick LARGE (*) NEGATIVE   Bilirubin Urine NEGATIVE  NEGATIVE   Ketones, ur NEGATIVE  NEGATIVE mg/dL   Protein, ur TRACE (*) NEGATIVE mg/dL   Urobilinogen, UA 0.2  0.0 - 1.0 mg/dL   Nitrite NEGATIVE  NEGATIVE   Leukocytes, UA TRACE (*) NEGATIVE  PREGNANCY, URINE      Result Value Range   Preg Test, Ur NEGATIVE  NEGATIVE  CBC WITH DIFFERENTIAL      Result Value Range   WBC 7.8  4.0 - 10.5 K/uL   RBC 4.57  3.87 - 5.11 MIL/uL   Hemoglobin 13.6  12.0 - 15.0 g/dL   HCT 16.1  09.6 - 04.5 %   MCV 87.3  78.0 - 100.0 fL   MCH 29.8  26.0 - 34.0 pg   MCHC 34.1  30.0 - 36.0 g/dL   RDW 40.9 (*) 81.1 - 91.4 %   Platelets 224  150 - 400 K/uL   Neutrophils Relative % 72  43 - 77 %   Neutro Abs 5.6  1.7 - 7.7 K/uL   Lymphocytes Relative 20  12 - 46 %   Lymphs Abs 1.6  0.7 - 4.0 K/uL   Monocytes Relative 6  3 - 12 %   Monocytes Absolute 0.5  0.1 - 1.0 K/uL   Eosinophils Relative 2  0 - 5 %   Eosinophils Absolute 0.1  0.0 - 0.7 K/uL   Basophils Relative 0  0 - 1 %   Basophils Absolute 0.0  0.0 - 0.1 K/uL  BASIC METABOLIC PANEL      Result Value Range   Sodium 136  135 - 145 mEq/L   Potassium 4.3  3.5 - 5.1 mEq/L   Chloride 102  96 - 112 mEq/L   CO2 26  19 - 32 mEq/L   Glucose, Bld 111 (*) 70 - 99 mg/dL   BUN 8  6 - 23 mg/dL   Creatinine, Ser 7.82  0.50 - 1.10 mg/dL   Calcium 9.3  8.4 - 95.6 mg/dL   GFR calc non Af Amer >90  >90 mL/min   GFR calc Af Amer >90  >90 mL/min  URINE MICROSCOPIC-ADD ON      Result Value Range   Squamous Epithelial / LPF FEW (*) RARE   WBC, UA 0-2  <3 WBC/hpf   RBC / HPF TOO NUMEROUS TO COUNT  <3 RBC/hpf   Labs Reviewed   URINALYSIS,  ROUTINE W REFLEX MICROSCOPIC - Abnormal; Notable for the following:    Color, Urine ORANGE (*)    APPearance CLOUDY (*)    Hgb urine dipstick LARGE (*)    Protein, ur TRACE (*)    Leukocytes, UA TRACE (*)    All other components within normal limits  CBC WITH DIFFERENTIAL - Abnormal; Notable for the following:    RDW 17.2 (*)    All other components within normal limits  BASIC METABOLIC PANEL - Abnormal; Notable for the following:    Glucose, Bld 111 (*)    All other components within normal limits  URINE MICROSCOPIC-ADD ON - Abnormal; Notable for the following:    Squamous Epithelial / LPF FEW (*)    All other components within normal limits  PREGNANCY, URINE   No results found. No diagnosis found.   US Transvaginal Non-ob  04/03/2013   *RADIOLOGY REPORT*  Clinical Data: Abdominal and pelvic pain, vaginal bleeding  TRANSABDOMINAL AND TRANSVAGINAL ULTRASOUND OF PELVIS Technique:  Both transabdominal and transvaginal ultrasound examinations of the pelvis were performed. Transabdominal technique was performed for global imaging of the pelvis including uterus, ovaries, adnexal regions, and pelvic cul-de-sac.  It was necessary to proceed with endovaginal exam following the transabdominal exam to visualize the left ovary and endometrium.  Comparison:  The  Findings:  Uterus: 7.1 x 2.8 x 5.0 cm.  Normal morphology without mass.  Endometrium: 5 mm thick, normal.  No endometrial fluid.  Right ovary:  4.2 x 3.8 x 4.1 cm.  Complex cystic lesion within the right ovary 2.9 x 3.1 x 3.3 cm, containing an eccentric area of heterogeneous isotope hypoechoic echogenicity.  This could represent a hemorrhagic cyst with layered debris/blood products. No blood flow is identified within the eccentric area of echogenicity on color Doppler imaging. Blood flow present within the remaining normal right ovary on color Doppler imaging.  Left ovary: 3.2 x 2.8 x 2.4 cm.  Normal morphology without mass.  Other  findings: No free fluid or adnexal masses otherwise seen.  IMPRESSION: Unremarkable uterus and left ovary. Complicated cystic lesion 2.9 x 3.1 x 3.3 cm within the right ovary containing eccentric internal material which lacks internal blood flow on color Doppler imaging. This could represent a complicated hemorrhagic right ovarian cyst though other etiologies are not excluded. Follow-up imaging is recommended in 6-12 weeks.   Original Report Authenticated By: Ulyses Southward, M.D.   US Pelvis Complete  04/03/2013   *RADIOLOGY REPORT*  Clinical Data: Abdominal and pelvic pain, vaginal bleeding  TRANSABDOMINAL AND TRANSVAGINAL ULTRASOUND OF PELVIS Technique:  Both transabdominal and transvaginal ultrasound examinations of the pelvis were performed. Transabdominal technique was performed for global imaging of the pelvis including uterus, ovaries, adnexal regions, and pelvic cul-de-sac.  It was necessary to proceed with endovaginal exam following the transabdominal exam to visualize the left ovary and endometrium.  Comparison:  The  Findings:  Uterus: 7.1 x 2.8 x 5.0 cm.  Normal morphology without mass.  Endometrium: 5 mm thick, normal.  No endometrial fluid.  Right ovary:  4.2 x 3.8 x 4.1 cm.  Complex cystic lesion within the right ovary 2.9 x 3.1 x 3.3 cm, containing an eccentric area of heterogeneous isotope hypoechoic echogenicity.  This could represent a hemorrhagic cyst with layered debris/blood products. No blood flow is identified within the eccentric area of echogenicity on color Doppler imaging. Blood flow present within the remaining normal right ovary on color Doppler imaging.  Left ovary: 3.2 x 2.8 x 2.4  cm.  Normal morphology without mass.  Other findings: No free fluid or adnexal masses otherwise seen.  IMPRESSION: Unremarkable uterus and left ovary. Complicated cystic lesion 2.9 x 3.1 x 3.3 cm within the right ovary containing eccentric internal material which lacks internal blood flow on color Doppler  imaging. This could represent a complicated hemorrhagic right ovarian cyst though other etiologies are not excluded. Follow-up imaging is recommended in 6-12 weeks.   Original Report Authenticated By: Ulyses Southward, M.D.   MDM  No acute abdomen. Ultrasound shows a right ovarian cyst.  Patient advised to get repeat ultrasound in 6-12 weeks. Rx Megace for dysfunctional uterine bleeding and Percocet for pain. I personally performed the services described in this documentation, which was scribed in my presence. The recorded information has been reviewed and is accurate.    Donnetta Hutching, MD 04/03/13 1901

## 2013-04-03 NOTE — ED Notes (Signed)
Pt alert & oriented x4, stable gait. Patient given discharge instructions, paperwork & prescription(s). Patient  instructed to stop at the registration desk to finish any additional paperwork. Patient verbalized understanding. Pt left department w/ no further questions. 

## 2013-04-03 NOTE — ED Notes (Signed)
Abnormal vaginal bleeding x 1 month.  Lower abd pain with nausea and diarrhea x 1 wk.  C/o pressure with urination.

## 2013-04-05 LAB — GC/CHLAMYDIA PROBE AMP: GC Probe RNA: NEGATIVE

## 2013-04-06 NOTE — ED Notes (Signed)
+   chlamydia Chart sent to EDP office for review. 

## 2013-04-07 ENCOUNTER — Telehealth (HOSPITAL_COMMUNITY): Payer: Self-pay | Admitting: *Deleted

## 2013-04-07 NOTE — ED Notes (Signed)
Chart returned from EDP office. With orders written by Rhea Bleacher for  Azithromycin 1000 mg po x 1 .

## 2013-04-10 NOTE — ED Notes (Signed)
  Unable to contact patient after 3 attempts, letter will be sent to address on file  Rhonda Good 04/10/2013, 4:53 PM

## 2013-04-15 ENCOUNTER — Emergency Department (HOSPITAL_COMMUNITY)
Admission: EM | Admit: 2013-04-15 | Discharge: 2013-04-15 | Discharge status: Against Medical Advice | Payer: Self-pay | Attending: Emergency Medicine | Admitting: Emergency Medicine

## 2013-04-15 ENCOUNTER — Emergency Department (HOSPITAL_COMMUNITY): Payer: Self-pay

## 2013-04-15 DIAGNOSIS — R55 Syncope and collapse: Secondary | ICD-10-CM | POA: Insufficient documentation

## 2013-04-15 DIAGNOSIS — R300 Dysuria: Secondary | ICD-10-CM | POA: Insufficient documentation

## 2013-04-15 DIAGNOSIS — S3981XA Other specified injuries of abdomen, initial encounter: Secondary | ICD-10-CM | POA: Insufficient documentation

## 2013-04-15 DIAGNOSIS — N938 Other specified abnormal uterine and vaginal bleeding: Secondary | ICD-10-CM | POA: Insufficient documentation

## 2013-04-15 DIAGNOSIS — J069 Acute upper respiratory infection, unspecified: Secondary | ICD-10-CM | POA: Insufficient documentation

## 2013-04-15 DIAGNOSIS — F329 Major depressive disorder, single episode, unspecified: Secondary | ICD-10-CM | POA: Insufficient documentation

## 2013-04-15 DIAGNOSIS — F17200 Nicotine dependence, unspecified, uncomplicated: Secondary | ICD-10-CM | POA: Insufficient documentation

## 2013-04-15 DIAGNOSIS — R062 Wheezing: Secondary | ICD-10-CM | POA: Insufficient documentation

## 2013-04-15 DIAGNOSIS — S20212A Contusion of left front wall of thorax, initial encounter: Secondary | ICD-10-CM

## 2013-04-15 DIAGNOSIS — F3289 Other specified depressive episodes: Secondary | ICD-10-CM | POA: Insufficient documentation

## 2013-04-15 DIAGNOSIS — N949 Unspecified condition associated with female genital organs and menstrual cycle: Secondary | ICD-10-CM | POA: Insufficient documentation

## 2013-04-15 DIAGNOSIS — S298XXA Other specified injuries of thorax, initial encounter: Secondary | ICD-10-CM | POA: Insufficient documentation

## 2013-04-15 DIAGNOSIS — R509 Fever, unspecified: Secondary | ICD-10-CM | POA: Insufficient documentation

## 2013-04-15 DIAGNOSIS — S20219A Contusion of unspecified front wall of thorax, initial encounter: Secondary | ICD-10-CM | POA: Insufficient documentation

## 2013-04-15 DIAGNOSIS — R5381 Other malaise: Secondary | ICD-10-CM | POA: Insufficient documentation

## 2013-04-15 DIAGNOSIS — R5383 Other fatigue: Secondary | ICD-10-CM | POA: Insufficient documentation

## 2013-04-15 DIAGNOSIS — R42 Dizziness and giddiness: Secondary | ICD-10-CM | POA: Insufficient documentation

## 2013-04-15 MED ORDER — KETOROLAC TROMETHAMINE 60 MG/2ML IM SOLN: 60.0000 mg | Freq: Once | INTRAMUSCULAR | Status: DC

## 2013-04-15 MED ORDER — ALBUTEROL SULFATE (5 MG/ML) 0.5% IN NEBU
2.5000 mg | INHALATION_SOLUTION | Freq: Once | RESPIRATORY_TRACT | Status: DC
  Filled 2013-04-15: qty 0.5

## 2013-04-15 MED ORDER — ONDANSETRON 8 MG PO TBDP: 8.0000 mg | ORAL_TABLET | Freq: Once | ORAL | Status: DC

## 2013-04-15 MED ORDER — PREDNISONE 50 MG PO TABS
60.0000 mg | ORAL_TABLET | Freq: Once | ORAL | Status: AC
  Administered 2013-04-15: 60 mg via ORAL
  Filled 2013-04-15: qty 1

## 2013-04-15 MED ORDER — CYCLOBENZAPRINE HCL 10 MG PO TABS
10.0000 mg | ORAL_TABLET | Freq: Once | ORAL | Status: AC
  Administered 2013-04-15: 10 mg via ORAL
  Filled 2013-04-15: qty 1

## 2013-04-15 MED ORDER — IBUPROFEN 800 MG PO TABS
800.0000 mg | ORAL_TABLET | Freq: Once | ORAL | Status: AC
  Administered 2013-04-15: 800 mg via ORAL
  Filled 2013-04-15: qty 1

## 2013-04-15 MED ORDER — AZITHROMYCIN 250 MG PO TABS: 1000.0000 mg | ORAL_TABLET | Freq: Once | ORAL | Status: DC

## 2013-04-15 MED ORDER — IPRATROPIUM BROMIDE 0.02 % IN SOLN
0.5000 mg | Freq: Once | RESPIRATORY_TRACT | Status: DC
  Filled 2013-04-15: qty 2.5
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
## 2013-04-15 NOTE — ED Notes (Signed)
Pt noted to be missing from room. Waited x 15 minutes, no return to room. Pt clothing no seen in room and hospital gown lying on bed. EDP notified.
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
## 2013-05-21 ENCOUNTER — Emergency Department (HOSPITAL_COMMUNITY)
Admission: EM | Admit: 2013-05-21 | Discharge: 2013-05-21 | Disposition: A | Discharge status: Home / Self Care | Payer: Self-pay | Attending: Emergency Medicine | Admitting: Emergency Medicine

## 2013-05-21 ENCOUNTER — Encounter (HOSPITAL_COMMUNITY): Payer: Self-pay

## 2013-05-21 ENCOUNTER — Emergency Department (HOSPITAL_COMMUNITY): Payer: Self-pay

## 2013-05-21 DIAGNOSIS — N939 Abnormal uterine and vaginal bleeding, unspecified: Secondary | ICD-10-CM

## 2013-05-21 DIAGNOSIS — G8929 Other chronic pain: Secondary | ICD-10-CM | POA: Insufficient documentation

## 2013-05-21 DIAGNOSIS — F17200 Nicotine dependence, unspecified, uncomplicated: Secondary | ICD-10-CM | POA: Insufficient documentation

## 2013-05-21 DIAGNOSIS — N83209 Unspecified ovarian cyst, unspecified side: Secondary | ICD-10-CM | POA: Insufficient documentation

## 2013-05-21 DIAGNOSIS — N898 Other specified noninflammatory disorders of vagina: Secondary | ICD-10-CM | POA: Insufficient documentation

## 2013-05-21 DIAGNOSIS — M549 Dorsalgia, unspecified: Secondary | ICD-10-CM | POA: Insufficient documentation

## 2013-05-21 DIAGNOSIS — R1031 Right lower quadrant pain: Secondary | ICD-10-CM | POA: Insufficient documentation

## 2013-05-21 LAB — CBC WITH DIFFERENTIAL/PLATELET
Basophils Absolute: 0 10*3/uL (ref 0.0–0.1)
Eosinophils Absolute: 0.1 10*3/uL (ref 0.0–0.7)
Eosinophils Relative: 1 % (ref 0–5)
MCH: 29 pg (ref 26.0–34.0)
MCV: 89.2 fL (ref 78.0–100.0)
Neutrophils Relative %: 77 % (ref 43–77)
Platelets: 253 10*3/uL (ref 150–400)
RDW: 17 % — ABNORMAL HIGH (ref 11.5–15.5)
WBC: 8.1 10*3/uL (ref 4.0–10.5)

## 2013-05-21 LAB — HCG, QUANTITATIVE, PREGNANCY: hCG, Beta Chain, Quant, S: <1 m[IU]/mL (ref <5)

## 2013-05-21 LAB — WET PREP, GENITAL
Trich, Wet Prep: NONE SEEN
Yeast Wet Prep HPF POC: NONE SEEN

## 2013-05-21 MED ORDER — MEGESTROL ACETATE 20 MG PO TABS: ORAL_TABLET | ORAL | Status: DC

## 2013-05-21 MED ORDER — OXYCODONE-ACETAMINOPHEN 5-325 MG PO TABS
1.0000 | ORAL_TABLET | Freq: Once | ORAL | Status: AC
  Administered 2013-05-21: 1 via ORAL
  Filled 2013-05-21: qty 1

## 2013-05-21 MED ORDER — OXYCODONE-ACETAMINOPHEN 5-325 MG PO TABS: 1.0000 | ORAL_TABLET | ORAL | Status: DC | PRN

## 2013-05-21 NOTE — ED Notes (Signed)
Per pt she has had vaginal bleeding since May of this year. Pt has been seen in ER for same and referred to GYN dr but unable to see d/t insurance. Pt reports unable to see health dept until Sept 23. Reports using 5 pads per day since May.

## 2013-05-21 NOTE — ED Provider Notes (Signed)
CSN: [id number]     Arrival date & time 05/21/13  1238 History   First MD Initiated Contact with Patient 05/21/13 1241     Chief Complaint  Patient presents with  . Vaginal Bleeding   (Consider location/radiation/quality/duration/timing/severity/associated sxs/prior Treatment) Patient is a 22 y.o. female presenting with vaginal bleeding. The history is provided by the patient.  Vaginal Bleeding Quality:  Bright red Severity:  Moderate Onset quality:  Gradual Duration:  4 months Timing:  Intermittent Progression:  Worsening Chronicity:  Recurrent Possible pregnancy: no   Context: spontaneously   Relieved by:  Nothing Associated symptoms: abdominal pain and back pain   Associated symptoms: no dysuria, no fever and no nausea    Rhonda Good is a 22 y.o. female who presents to the ED with vaginal bleeding since May. She states she uses 5 pads per day. She was evaluated here in May and had an ultrasound that showed a complex ovarian cyst. She was to have follow up in 6 weeks. Last sexual intercourse 2 weeks ago.   Past Medical History  Diagnosis Date  . Chronic back pain    Past Surgical History  Procedure Laterality Date  . Mouth surgery    . Hand surgery     No family history on file. History  Substance Use Topics  . Smoking status: Current Every Day Smoker -- 1.00 packs/day    Types: Cigarettes  . Smokeless tobacco: Not on file  . Alcohol Use: 0.0 oz/week   OB History   Grav Para Term Preterm Abortions TAB SAB Ect Mult Living                 Review of Systems  Constitutional: Negative for fever and chills.  HENT: Negative for neck pain.   Gastrointestinal: Positive for abdominal pain. Negative for nausea and vomiting.  Genitourinary: Positive for vaginal bleeding. Negative for dysuria and flank pain.  Musculoskeletal: Positive for back pain.  Skin: Negative for rash.  Neurological: Negative for syncope and headaches.  Psychiatric/Behavioral: The patient is not  nervous/anxious.     Allergies  Hydrocodone and Naproxen  Home Medications   Current Outpatient Rx  Name  Route  Sig  Dispense  Refill  . acetaminophen (TYLENOL) 500 MG tablet   Oral   Take 1,000 mg by mouth once as needed for pain.          Marland Kitchen oxyCODONE-acetaminophen (PERCOCET/ROXICET) 5-325 MG per tablet   Oral   Take 1 tablet by mouth every 4 (four) hours as needed for pain.          BP 128/94  Pulse 102  Temp(Src) 98.2 F (36.8 C) (Oral)  Resp 20  Wt 195 lb (88.451 kg)  BMI 31.49 kg/m2  SpO2 96%  LMP 05/21/2013 Physical Exam  Nursing note and vitals reviewed. Constitutional: She is oriented to person, place, and time. She appears well-developed and well-nourished. No distress.  HENT:  Head: Normocephalic.  Eyes: EOM are normal.  Neck: Neck supple.  Pulmonary/Chest: Effort normal.  Abdominal: Soft. There is tenderness in the right lower quadrant. There is no rebound, no guarding and no CVA tenderness.  Genitourinary:  External genitalia without lesions. Small amount of blood vaginal vault. No CMT, right adnexal tenderness. Uterus without palpable enlargement.  Musculoskeletal: Normal range of motion.  Neurological: She is alert and oriented to person, place, and time. No cranial nerve deficit.  Skin: Skin is warm and dry.  Psychiatric: She has a normal mood and affect. Her  behavior is normal.   Rhonda Good is a  Results for orders placed during the hospital encounter of 05/21/13 (from the past 24 hour(s))  CBC WITH DIFFERENTIAL     Status: Abnormal   Collection Time    05/21/13  1:18 PM      Result Value Range   WBC 8.1  4.0 - 10.5 K/uL   RBC 5.07  3.87 - 5.11 MIL/uL   Hemoglobin 14.7  12.0 - 15.0 g/dL   HCT 91.4  78.2 - 95.6 %   MCV 89.2  78.0 - 100.0 fL   MCH 29.0  26.0 - 34.0 pg   MCHC 32.5  30.0 - 36.0 g/dL   RDW 21.3 (*) 08.6 - 57.8 %   Platelets 253  150 - 400 K/uL   Neutrophils Relative % 77  43 - 77 %   Neutro Abs 6.2  1.7 - 7.7 K/uL    Lymphocytes Relative 17  12 - 46 %   Lymphs Abs 1.4  0.7 - 4.0 K/uL   Monocytes Relative 5  3 - 12 %   Monocytes Absolute 0.4  0.1 - 1.0 K/uL   Eosinophils Relative 1  0 - 5 %   Eosinophils Absolute 0.1  0.0 - 0.7 K/uL   Basophils Relative 0  0 - 1 %   Basophils Absolute 0.0  0.0 - 0.1 K/uL  HCG, QUANTITATIVE, PREGNANCY     Status: None   Collection Time    05/21/13  1:18 PM      Result Value Range   hCG, Beta Chain, Quant, S <1  <5 mIU/mL    ED Course  Procedures  US Transvaginal Non-ob  05/21/2013   CLINICAL DATA:  Pelvic pain. Vaginal bleeding.  EXAM: TRANSABDOMINAL AND TRANSVAGINAL ULTRASOUND OF PELVIS  TECHNIQUE: Both transabdominal and transvaginal ultrasound examinations of the pelvis were performed. Transabdominal technique was performed for global imaging of the pelvis including uterus, ovaries, adnexal regions, and pelvic cul-de-sac. It was necessary to proceed with endovaginal exam following the transabdominal exam to visualize the endometrium and ovaries.  COMPARISON:  04/03/2013  FINDINGS: Uterus  Measurements: 7.7 x 3.5 x 4.9 cm. No fibroids or other mass visualized.  Endometrium  Thickness: 2 mm  No focal abnormality visualized.  Right ovary  Measurements: 3.1 x 1.8 x 2.3 cm. Normal appearance/no adnexal mass. Previously seen hemorrhagic right ovarian cyst is no longer visualized.  Left ovary  Measurements: 2.7 x 2.2 x 3.2 cm. A hemorrhagic cyst with benign characteristics is seen which measures 2.8 x 2.2 x 2.9 cm, and is new since recent exam on 04/03/2013.  Other findings  No free fluid.  IMPRESSION: 2.9 cm hemorrhagic left ovarian cyst with benign characteristics, which is new since previous study.  Normal appearance of uterus and right ovary. Resolution of hemorrhagic right ovarian cyst since prior exam.   Electronically Signed   By: Myles Rosenthal   On: 05/21/2013 16:03   US Pelvis Complete  05/21/2013   CLINICAL DATA:  Pelvic pain. Vaginal bleeding.  EXAM: TRANSABDOMINAL AND  TRANSVAGINAL ULTRASOUND OF PELVIS  TECHNIQUE: Both transabdominal and transvaginal ultrasound examinations of the pelvis were performed. Transabdominal technique was performed for global imaging of the pelvis including uterus, ovaries, adnexal regions, and pelvic cul-de-sac. It was necessary to proceed with endovaginal exam following the transabdominal exam to visualize the endometrium and ovaries.  COMPARISON:  04/03/2013  FINDINGS: Uterus  Measurements: 7.7 x 3.5 x 4.9 cm. No fibroids or other mass visualized.  Endometrium  Thickness: 2 mm  No focal abnormality visualized.  Right ovary  Measurements: 3.1 x 1.8 x 2.3 cm. Normal appearance/no adnexal mass. Previously seen hemorrhagic right ovarian cyst is no longer visualized.  Left ovary  Measurements: 2.7 x 2.2 x 3.2 cm. A hemorrhagic cyst with benign characteristics is seen which measures 2.8 x 2.2 x 2.9 cm, and is new since recent exam on 04/03/2013.  Other findings  No free fluid.  IMPRESSION: 2.9 cm hemorrhagic left ovarian cyst with benign characteristics, which is new since previous study.  Normal appearance of uterus and right ovary. Resolution of hemorrhagic right ovarian cyst since prior exam.   Electronically Signed   By: Myles Rosenthal   On: 05/21/2013 16:03     MDM  22 y.o. female with abnormal vaginal bleeding and left ovarian cyst. No torsion visualized on ultrasound. Will start on Megace and she will keep her appointment with the health department on September 23. Patient stable for discharge home without any immediate complications.  I have reviewed this patient's vital signs, nurses notes, appropriate labs and imaging.  I have discussed results with the patient and plan of care and she voices understanding.    Medication List    TAKE these medications       megestrol 20 MG tablet  Commonly known as:  MEGACE  - Take two tablets (40 mg) three times per day time three days,   - then take two tablets (40 mg) two times per day time three  days,   - then take two tablets (40 mg)once per day      ASK your doctor about these medications       acetaminophen 500 MG tablet  Commonly known as:  TYLENOL  Take 1,000 mg by mouth once as needed for pain.     oxyCODONE-acetaminophen 5-325 MG per tablet  Commonly known as:  PERCOCET/ROXICET  Take 1 tablet by mouth every 4 (four) hours as needed for pain.  Ask about: Which instructions should I use?     oxyCODONE-acetaminophen 5-325 MG per tablet  Commonly known as:  PERCOCET/ROXICET  Take 1 tablet by mouth every 4 (four) hours as needed for pain.  Ask about: Which instructions should I use?           Janne Napoleon, [place] 05/22/13 1355

## 2013-05-22 LAB — GC/CHLAMYDIA PROBE AMP: CT Probe RNA: POSITIVE — AB

## 2013-05-22 NOTE — ED Provider Notes (Signed)
Medical screening examination/treatment/procedure(s) were performed by non-physician practitioner and as supervising physician I was immediately available for consultation/collaboration.   William Katelyne Galster, MD 05/22/13 2053 

## 2013-05-23 ENCOUNTER — Telehealth (HOSPITAL_COMMUNITY): Payer: Self-pay | Admitting: Emergency Medicine

## 2013-05-23 NOTE — ED Notes (Signed)
Patient has +Chlamydia. 

## 2013-05-23 NOTE — ED Notes (Signed)
Chart returned from EDP office. Per Trixie Dredge PA-C, Azithromycin 1 gram PO once.

## 2013-05-23 NOTE — ED Notes (Signed)
+  Chlamydia. Chart sent to EDP office for review. [facility] attached. 

## 2013-05-24 ENCOUNTER — Telehealth (HOSPITAL_COMMUNITY): Payer: Self-pay | Admitting: Emergency Medicine

## 2013-05-29 NOTE — ED Notes (Signed)
Unable to contact via phone letter sent to [facility] address. 

## 2013-07-25 ENCOUNTER — Emergency Department (HOSPITAL_COMMUNITY): Payer: Self-pay

## 2013-07-25 ENCOUNTER — Encounter (HOSPITAL_COMMUNITY)
Admission: EM | Disposition: A | Discharge status: Home / Self Care | Payer: Self-pay | Source: Home / Self Care | Attending: Orthopedic Surgery

## 2013-07-25 ENCOUNTER — Encounter (HOSPITAL_COMMUNITY): Payer: Self-pay | Admitting: Anesthesiology

## 2013-07-25 ENCOUNTER — Inpatient Hospital Stay (HOSPITAL_COMMUNITY): Payer: Self-pay

## 2013-07-25 ENCOUNTER — Inpatient Hospital Stay (HOSPITAL_COMMUNITY): Payer: Self-pay | Admitting: Anesthesiology

## 2013-07-25 ENCOUNTER — Encounter (HOSPITAL_COMMUNITY): Payer: Self-pay | Admitting: Emergency Medicine

## 2013-07-25 ENCOUNTER — Inpatient Hospital Stay (HOSPITAL_COMMUNITY)
Admission: EM | Admit: 2013-07-25 | Discharge: 2013-07-27 | DRG: 494 | Disposition: A | Discharge status: Home / Self Care | Payer: Self-pay | Attending: Orthopedic Surgery | Admitting: Orthopedic Surgery

## 2013-07-25 DIAGNOSIS — F17200 Nicotine dependence, unspecified, uncomplicated: Secondary | ICD-10-CM | POA: Diagnosis present

## 2013-07-25 DIAGNOSIS — Y92009 Unspecified place in unspecified non-institutional (private) residence as the place of occurrence of the external cause: Secondary | ICD-10-CM

## 2013-07-25 DIAGNOSIS — F1210 Cannabis abuse, uncomplicated: Secondary | ICD-10-CM | POA: Diagnosis present

## 2013-07-25 DIAGNOSIS — S82202A Unspecified fracture of shaft of left tibia, initial encounter for closed fracture: Secondary | ICD-10-CM

## 2013-07-25 DIAGNOSIS — F319 Bipolar disorder, unspecified: Secondary | ICD-10-CM | POA: Diagnosis present

## 2013-07-25 DIAGNOSIS — Z79899 Other long term (current) drug therapy: Secondary | ICD-10-CM

## 2013-07-25 DIAGNOSIS — Z7901 Long term (current) use of anticoagulants: Secondary | ICD-10-CM

## 2013-07-25 DIAGNOSIS — W108XXA Fall (on) (from) other stairs and steps, initial encounter: Secondary | ICD-10-CM | POA: Diagnosis present

## 2013-07-25 DIAGNOSIS — S82109A Unspecified fracture of upper end of unspecified tibia, initial encounter for closed fracture: Principal | ICD-10-CM | POA: Diagnosis present

## 2013-07-25 DIAGNOSIS — G8929 Other chronic pain: Secondary | ICD-10-CM | POA: Diagnosis present

## 2013-07-25 DIAGNOSIS — M549 Dorsalgia, unspecified: Secondary | ICD-10-CM | POA: Diagnosis present

## 2013-07-25 HISTORY — PX: ORIF TIBIA PLATEAU: SHX2132

## 2013-07-25 SURGERY — OPEN REDUCTION INTERNAL FIXATION (ORIF) TIBIAL PLATEAU
Anesthesia: General | Site: Leg Lower | Laterality: Left | Wound class: Clean

## 2013-07-25 MED ORDER — MIDAZOLAM HCL 5 MG/5ML IJ SOLN
INTRAMUSCULAR | Status: DC | PRN
  Administered 2013-07-25: 2 mg via INTRAVENOUS

## 2013-07-25 MED ORDER — HYDROMORPHONE HCL PF 1 MG/ML IJ SOLN
1.0000 mg | Freq: Once | INTRAMUSCULAR | Status: AC
  Administered 2013-07-25: 1 mg via INTRAVENOUS
  Filled 2013-07-25: qty 1

## 2013-07-25 MED ORDER — FENTANYL CITRATE 0.05 MG/ML IJ SOLN
INTRAMUSCULAR | Status: AC
  Filled 2013-07-25: qty 2

## 2013-07-25 MED ORDER — BUPIVACAINE-EPINEPHRINE PF 0.5-1:200000 % IJ SOLN
INTRAMUSCULAR | Status: DC | PRN
  Administered 2013-07-25: 150 mg

## 2013-07-25 MED ORDER — ONDANSETRON HCL 4 MG/2ML IJ SOLN: 4.0000 mg | Freq: Four times a day (QID) | INTRAMUSCULAR | Status: DC | PRN

## 2013-07-25 MED ORDER — ONDANSETRON HCL 4 MG/2ML IJ SOLN
4.0000 mg | Freq: Once | INTRAMUSCULAR | Status: AC
  Administered 2013-07-25: 4 mg via INTRAVENOUS
  Filled 2013-07-25: qty 2

## 2013-07-25 MED ORDER — ENOXAPARIN SODIUM 40 MG/0.4ML ~~LOC~~ SOLN
40.0000 mg | SUBCUTANEOUS | Status: DC
  Administered 2013-07-25 – 2013-07-26 (×2): 40 mg via SUBCUTANEOUS
  Filled 2013-07-25 (×3): qty 0.4

## 2013-07-25 MED ORDER — SODIUM CHLORIDE 0.9 % IJ SOLN
INTRAMUSCULAR | Status: DC | PRN
  Administered 2013-07-25: 18:00:00

## 2013-07-25 MED ORDER — LACTATED RINGERS IV SOLN: INTRAVENOUS | Status: DC | PRN

## 2013-07-25 MED ORDER — HYDROMORPHONE HCL PF 1 MG/ML IJ SOLN
0.2500 mg | INTRAMUSCULAR | Status: DC | PRN
  Administered 2013-07-25 (×3): 0.5 mg via INTRAVENOUS

## 2013-07-25 MED ORDER — PROMETHAZINE HCL 25 MG/ML IJ SOLN
INTRAMUSCULAR | Status: AC
  Administered 2013-07-25: 6.25 mg via INTRAVENOUS
  Filled 2013-07-25: qty 1

## 2013-07-25 MED ORDER — PROPOFOL 10 MG/ML IV BOLUS
INTRAVENOUS | Status: DC | PRN
  Administered 2013-07-25: 50 mg via INTRAVENOUS
  Administered 2013-07-25: 200 mg via INTRAVENOUS

## 2013-07-25 MED ORDER — OXYCODONE HCL 5 MG PO TABS
5.0000 mg | ORAL_TABLET | ORAL | Status: DC | PRN
  Administered 2013-07-25: 10 mg via ORAL
  Administered 2013-07-25: 5 mg via ORAL
  Administered 2013-07-26 (×4): 15 mg via ORAL
  Administered 2013-07-26: 5 mg via ORAL
  Administered 2013-07-26: 10 mg via ORAL
  Administered 2013-07-26: 15 mg via ORAL
  Filled 2013-07-25 (×3): qty 3
  Filled 2013-07-25 (×2): qty 1
  Filled 2013-07-25: qty 2
  Filled 2013-07-25 (×3): qty 3

## 2013-07-25 MED ORDER — BUPIVACAINE LIPOSOME 1.3 % IJ SUSP
20.0000 mL | INTRAMUSCULAR | Status: DC
  Filled 2013-07-25: qty 20

## 2013-07-25 MED ORDER — SODIUM CHLORIDE 0.9 % IV SOLN
INTRAVENOUS | Status: DC
  Administered 2013-07-25: 23:00:00 via INTRAVENOUS

## 2013-07-25 MED ORDER — MIDAZOLAM HCL 2 MG/2ML IJ SOLN
INTRAMUSCULAR | Status: AC
  Filled 2013-07-25: qty 2

## 2013-07-25 MED ORDER — DOCUSATE SODIUM 100 MG PO CAPS
100.0000 mg | ORAL_CAPSULE | Freq: Two times a day (BID) | ORAL | Status: DC
  Administered 2013-07-25 – 2013-07-27 (×4): 100 mg via ORAL
  Filled 2013-07-25 (×6): qty 1

## 2013-07-25 MED ORDER — ONDANSETRON HCL 4 MG/2ML IJ SOLN
INTRAMUSCULAR | Status: DC | PRN
  Administered 2013-07-25: 4 mg via INTRAVENOUS

## 2013-07-25 MED ORDER — LACTATED RINGERS IV SOLN
INTRAVENOUS | Status: DC
  Administered 2013-07-25: 15:00:00 via INTRAVENOUS

## 2013-07-25 MED ORDER — PROMETHAZINE HCL 25 MG/ML IJ SOLN
6.2500 mg | INTRAMUSCULAR | Status: DC | PRN
  Administered 2013-07-25: 6.25 mg via INTRAVENOUS

## 2013-07-25 MED ORDER — FENTANYL CITRATE 0.05 MG/ML IJ SOLN
INTRAMUSCULAR | Status: AC
  Administered 2013-07-25: 100 ug via INTRAVENOUS
  Filled 2013-07-25: qty 2

## 2013-07-25 MED ORDER — DEXAMETHASONE SODIUM PHOSPHATE 4 MG/ML IJ SOLN
INTRAMUSCULAR | Status: DC | PRN
  Administered 2013-07-25: 8 mg

## 2013-07-25 MED ORDER — ACETAMINOPHEN 325 MG PO TABS
650.0000 mg | ORAL_TABLET | Freq: Four times a day (QID) | ORAL | Status: DC | PRN
  Administered 2013-07-25: 650 mg via ORAL
  Filled 2013-07-25: qty 2

## 2013-07-25 MED ORDER — HYDROMORPHONE HCL PF 1 MG/ML IJ SOLN
1.0000 mg | INTRAMUSCULAR | Status: DC | PRN
  Administered 2013-07-25: 1 mg via INTRAVENOUS
  Filled 2013-07-25: qty 1

## 2013-07-25 MED ORDER — INFLUENZA VAC SPLIT QUAD 0.5 ML IM SUSP
0.5000 mL | INTRAMUSCULAR | Status: AC
  Administered 2013-07-27: 0.5 mL via INTRAMUSCULAR
  Filled 2013-07-25: qty 0.5

## 2013-07-25 MED ORDER — ONDANSETRON HCL 4 MG/2ML IJ SOLN
4.0000 mg | Freq: Three times a day (TID) | INTRAMUSCULAR | Status: DC | PRN
  Administered 2013-07-25: 4 mg via INTRAVENOUS
  Filled 2013-07-25: qty 2

## 2013-07-25 MED ORDER — SODIUM CHLORIDE 0.9 % IV SOLN
INTRAVENOUS | Status: DC
  Administered 2013-07-25: 11:00:00 via INTRAVENOUS

## 2013-07-25 MED ORDER — OXYCODONE HCL 5 MG PO TABS
ORAL_TABLET | ORAL | Status: AC
  Administered 2013-07-25: 10 mg via ORAL
  Filled 2013-07-25: qty 2

## 2013-07-25 MED ORDER — HYDROMORPHONE HCL PF 1 MG/ML IJ SOLN
0.5000 mg | Freq: Two times a day (BID) | INTRAMUSCULAR | Status: DC | PRN
  Administered 2013-07-26 (×2): 1 mg via INTRAVENOUS
  Filled 2013-07-25 (×3): qty 1

## 2013-07-25 MED ORDER — SUCCINYLCHOLINE CHLORIDE 20 MG/ML IJ SOLN
INTRAMUSCULAR | Status: DC | PRN
  Administered 2013-07-25: 120 mg via INTRAVENOUS

## 2013-07-25 MED ORDER — CEFAZOLIN SODIUM-DEXTROSE 2-3 GM-% IV SOLR
INTRAVENOUS | Status: AC
  Administered 2013-07-25: 2 g via INTRAVENOUS
  Filled 2013-07-25: qty 50

## 2013-07-25 MED ORDER — SUFENTANIL CITRATE 50 MCG/ML IV SOLN
INTRAVENOUS | Status: DC | PRN
  Administered 2013-07-25 (×2): 10 ug via INTRAVENOUS

## 2013-07-25 MED ORDER — FENTANYL CITRATE 0.05 MG/ML IJ SOLN
100.0000 ug | Freq: Once | INTRAMUSCULAR | Status: AC
  Administered 2013-07-25: 100 ug via INTRAVENOUS

## 2013-07-25 MED ORDER — HYDROMORPHONE HCL PF 1 MG/ML IJ SOLN
INTRAMUSCULAR | Status: AC
  Administered 2013-07-25: 0.5 mg via INTRAVENOUS
  Filled 2013-07-25: qty 2

## 2013-07-25 MED ORDER — 0.9 % SODIUM CHLORIDE (POUR BTL) OPTIME
TOPICAL | Status: DC | PRN
  Administered 2013-07-25: 1000 mL

## 2013-07-25 MED ORDER — MIDAZOLAM HCL 2 MG/2ML IJ SOLN
INTRAMUSCULAR | Status: AC
  Administered 2013-07-25: 2 mg via INTRAVENOUS
  Filled 2013-07-25: qty 2

## 2013-07-25 MED ORDER — MIDAZOLAM HCL 2 MG/2ML IJ SOLN
2.0000 mg | Freq: Once | INTRAMUSCULAR | Status: AC
  Administered 2013-07-25: 2 mg via INTRAVENOUS

## 2013-07-25 MED ORDER — SENNA 8.6 MG PO TABS
2.0000 | ORAL_TABLET | Freq: Two times a day (BID) | ORAL | Status: DC
  Administered 2013-07-25 – 2013-07-27 (×4): 17.2 mg via ORAL
  Filled 2013-07-25 (×5): qty 2

## 2013-07-25 MED ORDER — ALBUTEROL SULFATE HFA 108 (90 BASE) MCG/ACT IN AERS
INHALATION_SPRAY | RESPIRATORY_TRACT | Status: DC | PRN
  Administered 2013-07-25: 3 via RESPIRATORY_TRACT

## 2013-07-25 MED ORDER — LIDOCAINE HCL (CARDIAC) 20 MG/ML IV SOLN
INTRAVENOUS | Status: DC | PRN
  Administered 2013-07-25: 100 mg via INTRAVENOUS

## 2013-07-25 MED ORDER — LACTATED RINGERS IV SOLN
INTRAVENOUS | Status: DC | PRN
  Administered 2013-07-25 (×2): via INTRAVENOUS

## 2013-07-25 MED ORDER — HYDROMORPHONE HCL PF 1 MG/ML IJ SOLN
1.0000 mg | Freq: Once | INTRAMUSCULAR | Status: AC
  Administered 2013-07-25: 1 mg via INTRAMUSCULAR
  Filled 2013-07-25: qty 1

## 2013-07-25 MED ORDER — ONDANSETRON HCL 4 MG PO TABS: 4.0000 mg | ORAL_TABLET | Freq: Four times a day (QID) | ORAL | Status: DC | PRN

## 2013-07-25 MED ORDER — ACETAMINOPHEN 325 MG PO TABS
ORAL_TABLET | ORAL | Status: AC
  Administered 2013-07-25: 650 mg via ORAL
  Filled 2013-07-25: qty 2

## 2013-07-25 SURGICAL SUPPLY — 67 items
BANDAGE ELASTIC 6 VELCRO ST LF (GAUZE/BANDAGES/DRESSINGS) ×1 IMPLANT
BANDAGE ESMARK 6X9 LF (GAUZE/BANDAGES/DRESSINGS) ×1 IMPLANT
BIT DRILL 2.5X2.75 QC CALB (BIT) ×1 IMPLANT
BLADE SURG 10 STRL SS (BLADE) ×2 IMPLANT
BNDG CMPR 9X6 STRL LF SNTH (GAUZE/BANDAGES/DRESSINGS) ×1
BNDG COHESIVE 4X5 TAN STRL (GAUZE/BANDAGES/DRESSINGS) ×2 IMPLANT
BNDG COHESIVE 6X5 TAN STRL LF (GAUZE/BANDAGES/DRESSINGS) ×2 IMPLANT
BNDG ESMARK 6X9 LF (GAUZE/BANDAGES/DRESSINGS) ×2
CHLORAPREP W/TINT 26ML (MISCELLANEOUS) ×2 IMPLANT
CLOTH BEACON ORANGE TIMEOUT ST (SAFETY) ×2 IMPLANT
COVER MAYO STAND STRL (DRAPES) ×2 IMPLANT
COVER SURGICAL LIGHT HANDLE (MISCELLANEOUS) ×2 IMPLANT
CUFF TOURNIQUET SINGLE 34IN LL (TOURNIQUET CUFF) ×2 IMPLANT
CUFF TOURNIQUET SINGLE 44IN (TOURNIQUET CUFF) IMPLANT
DRAPE C-ARM 42X72 X-RAY (DRAPES) ×2 IMPLANT
DRAPE C-ARMOR (DRAPES) ×2 IMPLANT
DRAPE U-SHAPE 47X51 STRL (DRAPES) ×2 IMPLANT
DRSG ADAPTIC 3X8 NADH LF (GAUZE/BANDAGES/DRESSINGS) ×1 IMPLANT
DRSG PAD ABDOMINAL 8X10 ST (GAUZE/BANDAGES/DRESSINGS) ×1 IMPLANT
ELECT REM PT RETURN 9FT ADLT (ELECTROSURGICAL) ×2
ELECTRODE REM PT RTRN 9FT ADLT (ELECTROSURGICAL) ×1 IMPLANT
GLOVE BIO SURGEON STRL SZ8 (GLOVE) ×2 IMPLANT
GLOVE BIOGEL PI IND STRL 8 (GLOVE) ×1 IMPLANT
GLOVE BIOGEL PI INDICATOR 8 (GLOVE) ×1
GOWN PREVENTION PLUS XLARGE (GOWN DISPOSABLE) ×2 IMPLANT
GOWN STRL NON-REIN LRG LVL3 (GOWN DISPOSABLE) ×4 IMPLANT
K-WIRE SMOOTH (WIRE) ×4
KIT BASIN OR (CUSTOM PROCEDURE TRAY) ×2 IMPLANT
KIT ROOM TURNOVER OR (KITS) ×2 IMPLANT
KWIRE SMOOTH (WIRE) IMPLANT
MANIFOLD NEPTUNE II (INSTRUMENTS) ×2 IMPLANT
NEEDLE 22X1 1/2 (OR ONLY) (NEEDLE) ×1 IMPLANT
NS IRRIG 1000ML POUR BTL (IV SOLUTION) ×2 IMPLANT
PACK ORTHO EXTREMITY (CUSTOM PROCEDURE TRAY) ×2 IMPLANT
PAD ARMBOARD 7.5X6 YLW CONV (MISCELLANEOUS) ×4 IMPLANT
PAD CAST 4YDX4 CTTN HI CHSV (CAST SUPPLIES) ×1 IMPLANT
PADDING CAST COTTON 4X4 STRL (CAST SUPPLIES) ×2
PLATE ACE META (Plate) ×2 IMPLANT
PLATE ACE SM 71.4X30.5X1X3 HL (Plate) IMPLANT
SCREW CORT FT 32X3.5XNONLOCK (Screw) IMPLANT
SCREW CORTICAL 3.5MM  32MM (Screw) ×1 IMPLANT
SCREW CORTICAL 3.5MM  60MM (Screw) ×2 IMPLANT
SCREW CORTICAL 3.5MM 32MM (Screw) ×1 IMPLANT
SCREW CORTICAL 3.5MM 36MM (Screw) ×1 IMPLANT
SCREW CORTICAL 3.5MM 40MM (Screw) ×1 IMPLANT
SCREW CORTICAL 3.5MM 60MM (Screw) IMPLANT
SCREW LAG CANN CANC 5.0 20X70 (Screw) ×2 IMPLANT
SOAP 2 % CHG 4 OZ (WOUND CARE) ×2 IMPLANT
SPONGE GAUZE 4X4 12PLY (GAUZE/BANDAGES/DRESSINGS) ×1 IMPLANT
SPONGE LAP 18X18 X RAY DECT (DISPOSABLE) ×2 IMPLANT
STAPLER VISISTAT 35W (STAPLE) IMPLANT
STOCKINETTE IMPERVIOUS 9X36 MD (GAUZE/BANDAGES/DRESSINGS) ×2 IMPLANT
SUCTION FRAZIER TIP 10 FR DISP (SUCTIONS) ×2 IMPLANT
SUT ETHILON 2 0 FS 18 (SUTURE) ×2 IMPLANT
SUT MNCRL AB 3-0 PS2 18 (SUTURE) ×2 IMPLANT
SUT PROLENE 3 0 PS 2 (SUTURE) IMPLANT
SUT VIC AB 2-0 CT1 27 (SUTURE) ×2
SUT VIC AB 2-0 CT1 TAPERPNT 27 (SUTURE) ×1 IMPLANT
SUT VIC AB 3-0 PS2 18 (SUTURE)
SUT VIC AB 3-0 PS2 18XBRD (SUTURE) IMPLANT
SYR 20CC LL (SYRINGE) ×1 IMPLANT
SYR CONTROL 10ML LL (SYRINGE) IMPLANT
TOWEL OR 17X24 6PK STRL BLUE (TOWEL DISPOSABLE) ×2 IMPLANT
TOWEL OR 17X26 10 PK STRL BLUE (TOWEL DISPOSABLE) ×2 IMPLANT
TUBE CONNECTING 12X1/4 (SUCTIONS) ×2 IMPLANT
WATER STERILE IRR 1000ML POUR (IV SOLUTION) ×1 IMPLANT
YANKAUER SUCT BULB TIP NO VENT (SUCTIONS) ×2 IMPLANT

## 2013-07-25 NOTE — ED Notes (Signed)
Pt states that she got up this am and tripped over some clothes that were in the floor causing her to fall. Pt c/o pain to left knee area, knee tender with palpation, cms intact distal. Ice pack applied.

## 2013-07-25 NOTE — ED Provider Notes (Signed)
CSN: [id number]     Arrival date & time 07/25/13  1004 History   First MD Initiated Contact with Patient 07/25/13 1007     Chief Complaint  Patient presents with  . Knee Pain   (Consider location/radiation/quality/duration/timing/severity/associated sxs/prior Treatment) Patient is a 22 y.o. female presenting with knee pain. The history is provided by the patient.  Knee Pain Location:  Knee Injury: yes   Mechanism of injury: fall   Fall:    Fall occurred:  Tripped   Impact surface:  Hard floor   Point of impact:  Knees Knee location:  L knee Pain details:    Quality:  Shooting and sharp   Radiates to:  L leg   Severity:  Severe   Onset quality:  Sudden   Timing:  Constant   Progression:  Unchanged Chronicity:  New Foreign body present:  No foreign bodies Prior injury to area:  No Worsened by:  Nothing tried Ineffective treatments:  None tried Associated symptoms: no fever    Rhonda Good is a 22 y.o. female who presents to the ED with left knee pain. There is a small step that goes into the bathroom. She tripped over some clothes this morning and twisted and fell on her left knee.   Past Medical History  Diagnosis Date  . Chronic back pain    Past Surgical History  Procedure Laterality Date  . Mouth surgery    . Hand surgery     No family history on file. History  Substance Use Topics  . Smoking status: Current Every Day Smoker -- 1.00 packs/day    Types: Cigarettes  . Smokeless tobacco: Not on file  . Alcohol Use: 0.0 oz/week     Comment: last night,    OB History   Grav Para Term Preterm Abortions TAB SAB Ect Mult Living                 Review of Systems  Constitutional: Negative for fever and chills.  HENT: Negative for congestion and trouble swallowing.   Eyes: Negative for visual disturbance.  Respiratory: Negative for shortness of breath.   Cardiovascular: Negative for chest pain.  Gastrointestinal: Negative for nausea, vomiting and abdominal  pain.  Genitourinary: Negative for dysuria and urgency.  Musculoskeletal:       Left knee pain  Skin: Negative for wound.  Allergic/Immunologic: Negative for immunocompromised state.  Neurological: Negative for headaches.  Psychiatric/Behavioral: The patient is not nervous/anxious.     Allergies  Hydrocodone and Naproxen  Home Medications  No current outpatient prescriptions on file. BP 116/53  Pulse 94  Temp(Src) 98.1 F (36.7 C) (Oral)  Resp 18  SpO2 98%  LMP 07/11/2013 Physical Exam  Nursing note and vitals reviewed. Constitutional: She is oriented to person, place, and time. She appears well-developed and well-nourished. No distress.  HENT:  Head: Normocephalic and atraumatic.  Eyes: EOM are normal.  Neck: Neck supple.  Cardiovascular: Normal rate.   Pulmonary/Chest: Effort normal.  Abdominal: Soft. There is no tenderness.  Musculoskeletal:       Left knee: She exhibits decreased range of motion and swelling. Tenderness found.       Legs: Difficulty with exam due to severe pain with palpation or any range of motion. Pedal pulse strong, adequate circulation, good touch sensation.  Neurological: She is alert and oriented to person, place, and time. No cranial nerve deficit.  Skin: Skin is warm and dry.  Psychiatric: She has a normal mood and affect.  Her behavior is normal.    Ct Knee Left Wo Contrast  07/25/2013   CLINICAL DATA:  Twisting injury today. Intra-articular fracture of the proximal tibia on radiographs.  EXAM: CT OF THE LEFT KNEE WITHOUT CONTRAST  TECHNIQUE: Multidetector CT imaging was performed according to the standard protocol. Multiplanar CT image reconstructions were also generated.  COMPARISON:  Radiographs 07/25/2013.  FINDINGS: As demonstrated radiographically, there is a mildly displaced intra-articular oblique fracture of the proximal tibia. This extends from the lateral tibial plateau through the medial tibial metaphyseal cortex. Posteriorly at the  articular surface, there is up to 5 mm of displacement of the fracture and 3 mm of cortical offset. The fracture demonstrates nondisplaced components extending to the more peripheral lateral tibial plateau and intercondylar region. The medial tibial plateau articular surface appears intact.  The distal femur, patella and proximal fibula are intact.  There is a moderate size lipohemarthrosis. As evaluated by CT, the cruciate ligaments appear grossly intact.  IMPRESSION: Intra-articular oblique fracture of the proximal tibia as described with involvement of the lateral tibial plateau articular surface.   Electronically Signed   By: Roxy Horseman M.D.   On: 07/25/2013 11:58   Dg Knee Complete 4 Views Left  07/25/2013   CLINICAL DATA:  Left knee pain.  EXAM: LEFT KNEE - COMPLETE 4+ VIEW  COMPARISON:  None.  FINDINGS: Oblique fracture is present through the medial tibial plateau. This appears to extend into the intercondylar notch. Depression is difficult to evaluate based on obliquity. CT of the knee is recommended. Large effusion is present, likely representing lipohemarthrosis.  IMPRESSION: Medial tibial plateau fracture. CT of the knee recommended for full characterization.   Electronically Signed   By: Andreas Newport M.D.   On: 07/25/2013 10:53    ED Course: Consult with Dr. Victorino Dike and he will accept the patient at [facility].   Procedures EKG Interpretation   None       MDM  22 y.o. female with tibial plateau fracture left. Will transfer to [facility] for admission to orthopedic unit. Dr. Victorino Dike to take over care of the patient on arrival. Patient placed in ace wrap and knee immobilizer prior to transfer. Discussed with patient in detail plan of care and need for NPO status. Patient voices understanding.     Janne Napoleon, NP 07/25/13 1229

## 2013-07-25 NOTE — ED Notes (Signed)
Pt assisted to bedpan, tolerated well, was able to reposition herself to her back, knee repositioned with pillows, cms remains intact

## 2013-07-25 NOTE — ED Notes (Signed)
Pt states that she has to urinate, offered assistance to bedpan several times, pt refused each offer.

## 2013-07-25 NOTE — Anesthesia Postprocedure Evaluation (Signed)
Anesthesia Post Note  Patient: Rhonda Good  Procedure(s) Performed: Procedure(s) (LRB): OPEN REDUCTION INTERNAL FIXATION (ORIF) TIBIAL PLATEAU (Left)  Anesthesia type: general  Patient location: PACU  Post pain: Pain level controlled  Post assessment: Patient's Cardiovascular Status Stable   Post vital signs: Reviewed and stable  Level of consciousness: sedated  Complications: No apparent anesthesia complications

## 2013-07-25 NOTE — Anesthesia Preprocedure Evaluation (Addendum)
Anesthesia Evaluation  Patient identified by MRN, date of birth, ID band Patient awake    Reviewed: Allergy & Precautions, H&P , NPO status , Patient's Chart, lab work & pertinent test results  Airway       Dental   Pulmonary neg pulmonary ROS,  breath sounds clear to auscultation        Cardiovascular negative cardio ROS  Rhythm:regular Rate:Normal     Neuro/Psych PSYCHIATRIC DISORDERS Bipolar Disorder negative neurological ROS     GI/Hepatic negative GI ROS, (+)     substance abuse  alcohol use and marijuana use,   Endo/Other  negative endocrine ROS  Renal/GU negative Renal ROS     Musculoskeletal   Abdominal   Peds  Hematology   Anesthesia Other Findings   Reproductive/Obstetrics negative OB ROS                          Anesthesia Physical Anesthesia Plan  ASA: II  Anesthesia Plan: General LMA   Post-op Pain Management:    Induction: Intravenous and Rapid sequence  Airway Management Planned: Oral ETT  Additional Equipment:   Intra-op Plan:   Post-operative Plan: Extubation in OR  Informed Consent:   Dental advisory given  Plan Discussed with: Anesthesiologist and Surgeon  Anesthesia Plan Comments:        Anesthesia Quick Evaluation

## 2013-07-25 NOTE — ED Notes (Addendum)
Pt has smell of etoh on breath, states that she drunk two 40 ounces beer last night, last one around 11pm.

## 2013-07-25 NOTE — Anesthesia Procedure Notes (Addendum)
Anesthesia Regional Block:  Adductor canal block  Pre-Anesthetic Checklist: ,, timeout performed, Correct Patient, Correct Site, Correct Laterality, Correct Procedure, Correct Position, site marked, Risks and benefits discussed,  Surgical consent,  Pre-op evaluation,  At surgeon's request and post-op pain management  Laterality: Left  Prep: chloraprep       Needles:   Needle Type: Echogenic Stimulator Needle     Needle Length: 10cm 10 cm Needle Gauge: 20 and 20 G    Additional Needles:  Procedures: ultrasound guided (picture in chart) Adductor canal block Narrative:  Start time: 07/25/2013 3:16 PM End time: 07/25/2013 3:26 PM Injection made incrementally with aspirations every 5 mL.  Adductor canal block

## 2013-07-25 NOTE — Transfer of Care (Signed)
Immediate Anesthesia Transfer of Care Note  Patient: Rhonda Good  Procedure(s) Performed: Procedure(s): OPEN REDUCTION INTERNAL FIXATION (ORIF) TIBIAL PLATEAU (Left)  Patient Location: PACU  Anesthesia Type:GA combined with regional for post-op pain  Level of Consciousness: oriented, sedated, patient cooperative and responds to stimulation  Airway & Oxygen Therapy: Patient Spontanous Breathing and Patient connected to nasal cannula oxygen  Post-op Assessment: Report given to PACU RN, Post -op Vital signs reviewed and stable and Patient moving all extremities X 4  Post vital signs: Reviewed and stable  Complications: No apparent anesthesia complications

## 2013-07-25 NOTE — ED Notes (Signed)
Report called to Lelon Huh at [facility], and Astronomer from [place]. ETA for transport 23 minutes.

## 2013-07-25 NOTE — H&P (Signed)
Rhonda Good is an 22 y.o. female.   Chief Complaint: left knee pain HPI: 22 y/o female with PMH of chronic back pain and smoking fell this morning on a step at home.  She injured her left knee and has not been able to bear weight since then.  She was taken to the ER at [facility] and transferred down to [facility] via [facility].  She denies any h/o previous surgery or injury to her left knee in the past.  She denies numbness, tingling or weakness in the L LE.  She c/o aching pain that is severe in intensity while at rest and sharp and severe with any motion.  She denies recent f/c/n/v/wt loss.  She denies any h/o diabetes.  She last ate or drank at 0100 this morning.  Past Medical History  Diagnosis Date  . Chronic back pain     Past Surgical History  Procedure Laterality Date  . Mouth surgery    . Hand surgery      Family history:  Diabetes, htn  Social History:  reports that she has been smoking Cigarettes.  She has been smoking about 1.00 pack per day. She does not have any smokeless tobacco history on file. She reports that she drinks alcohol. She reports that she uses illicit drugs (Marijuana).  Drinks 4-5 nights per weeks.  Two 40 oz beers last night.  Allergies:  Allergies  Allergen Reactions  . Hydrocodone Hives  . Naproxen Itching and Swelling    No prescriptions prior to admission    No results found for this or any previous visit (from the past 48 hour(s)). Ct Knee Left Wo Contrast  07/25/2013   CLINICAL DATA:  Twisting injury today. Intra-articular fracture of the proximal tibia on radiographs.  EXAM: CT OF THE LEFT KNEE WITHOUT CONTRAST  TECHNIQUE: Multidetector CT imaging was performed according to the standard protocol. Multiplanar CT image reconstructions were also generated.  COMPARISON:  Radiographs 07/25/2013.  FINDINGS: As demonstrated radiographically, there is a mildly displaced intra-articular oblique fracture of the proximal tibia. This extends from the lateral  tibial plateau through the medial tibial metaphyseal cortex. Posteriorly at the articular surface, there is up to 5 mm of displacement of the fracture and 3 mm of cortical offset. The fracture demonstrates nondisplaced components extending to the more peripheral lateral tibial plateau and intercondylar region. The medial tibial plateau articular surface appears intact.  The distal femur, patella and proximal fibula are intact.  There is a moderate size lipohemarthrosis. As evaluated by CT, the cruciate ligaments appear grossly intact.  IMPRESSION: Intra-articular oblique fracture of the proximal tibia as described with involvement of the lateral tibial plateau articular surface.   Electronically Signed   By: Roxy Horseman M.D.   On: 07/25/2013 11:58   Dg Knee Complete 4 Views Left  07/25/2013   CLINICAL DATA:  Left knee pain.  EXAM: LEFT KNEE - COMPLETE 4+ VIEW  COMPARISON:  None.  FINDINGS: Oblique fracture is present through the medial tibial plateau. This appears to extend into the intercondylar notch. Depression is difficult to evaluate based on obliquity. CT of the knee is recommended. Large effusion is present, likely representing lipohemarthrosis.  IMPRESSION: Medial tibial plateau fracture. CT of the knee recommended for full characterization.   Electronically Signed   By: Andreas Newport M.D.   On: 07/25/2013 10:53    ROS  No recent f/c/n/v/wt loss  Blood pressure 111/75, pulse 84, temperature 98.1 F (36.7 C), temperature source Oral, resp.  rate 18, last menstrual period 07/11/2013, SpO2 98.00%. Physical Exam  wn wd female in nad.  A and O x 4.  Mood and affect normal.  EOMI.  Resp unlabored.  L knee with healthy skin and slight swelling.  No gross deformity at the L LE.  Sens to LT intact at the medial knee, dorsal and plantar foot.  5/5 strength in PF and DF of the ankle and toes.  No lymphadenopathy about the left LE.  2+ dp and pt pulses.    Assessment/Plan Left tibial plateau fracture -  to OR for ORIF.  This is a displaced, unstable intra-articular fracture requiring ORIF to return the function of the knee and decrease the risk of post-traumatic arthritis.  The risks and benefits of the alternative treatment options have been discussed in detail.  The patient wishes to proceed with surgery and specifically understands risks of bleeding, infection, nerve damage, blood clots, need for additional surgery, amputation and death.    Toni Arthurs 08-05-13, 2:53 PM

## 2013-07-25 NOTE — ED Notes (Signed)
Pt placed in position of comfort, pillow used for splint on left knee, cms intact distal. Hope NP at bedside,

## 2013-07-25 NOTE — ED Provider Notes (Signed)
History/physical exam/procedure(s) were performed by non-physician practitioner and as supervising physician I was immediately available for consultation/collaboration. I have reviewed all notes and am in agreement with care and plan.   Mckinnley Cottier S Karaline Buresh, MD 07/25/13 1338 

## 2013-07-25 NOTE — Progress Notes (Signed)
Post-op VS on return to 5n

## 2013-07-25 NOTE — ED Notes (Signed)
Pt ransfered to [facility] [place].

## 2013-07-25 NOTE — Brief Op Note (Signed)
07/25/2013  6:12 PM  PATIENT:  Rhonda Good  22 y.o. female  PRE-OPERATIVE DIAGNOSIS: left tibial plateau fracture (Schatzker IV)  POST-OPERATIVE DIAGNOSIS:  same  Procedure(s): Open reduction and internal fixation of left tibial plateau fracture  SURGEON:  Toni Arthurs, MD  ASSISTANT: n/a  ANESTHESIA:   General, regional  EBL:  minimal   TOURNIQUET:   Total Tourniquet Time Documented: Thigh (Left) - 81 minutes Total: Thigh (Left) - 81 minutes   COMPLICATIONS:  None apparent  DISPOSITION:  Extubated, awake and stable to recovery.  DICTATION ID:  [id number]

## 2013-07-26 MED ORDER — DIPHENHYDRAMINE HCL 25 MG PO CAPS
25.0000 mg | ORAL_CAPSULE | Freq: Four times a day (QID) | ORAL | Status: DC | PRN
  Administered 2013-07-26 – 2013-07-27 (×2): 25 mg via ORAL
  Filled 2013-07-26 (×2): qty 1

## 2013-07-26 MED ORDER — OXYCODONE-ACETAMINOPHEN 5-325 MG PO TABS
1.0000 | ORAL_TABLET | Freq: Four times a day (QID) | ORAL | Status: DC | PRN
  Administered 2013-07-26 – 2013-07-27 (×2): 2 via ORAL
  Filled 2013-07-26 (×2): qty 2

## 2013-07-26 MED ORDER — OXYCODONE HCL 5 MG PO TABS
5.0000 mg | ORAL_TABLET | Freq: Four times a day (QID) | ORAL | Status: DC | PRN
  Administered 2013-07-26 – 2013-07-27 (×2): 10 mg via ORAL
  Filled 2013-07-26 (×2): qty 2

## 2013-07-26 NOTE — Progress Notes (Signed)
Utilization Review Completed.Rhonda Good T11/10/2012  

## 2013-07-26 NOTE — Op Note (Signed)
Rhonda Good, Rhonda Good                ACCOUNT NO.:  [id number]  MEDICAL RECORD NO.:  [id number]  LOCATION:  5N01C                        FACILITY:  [facility]  PHYSICIAN:  Toni Arthurs, MD        DATE OF BIRTH:  [birth year]  DATE OF PROCEDURE:  07/25/2013 DATE OF DISCHARGE:                              OPERATIVE REPORT   PREOPERATIVE DIAGNOSIS:  Left tibial plateau fracture (Schatzker IV).  POSTOPERATIVE DIAGNOSIS:  Left tibial plateau fracture (Schatzker IV).  PROCEDURE:  Open reduction and internal fixation of left tibial plateau fracture.  SURGEON:  Toni Arthurs, MD  ANESTHESIA:  General, regional.  ESTIMATED BLOOD LOSS:  Minimal.  TOURNIQUET TIME:  81 minutes at 250 mmHg.  COMPLICATIONS:  None apparent.  DISPOSITION:  Extubated, awake and stable to recovery.  INDICATIONS FOR PROCEDURE:  The patient is a 22 year old female with past medical history significant for chronic back pain and cigarette smoking.  She fell this morning injuring her left knee at home.  She was seen at [facility] and transferred to [facility].  A CT scan and plain films revealed a Schatzker IV fracture of the tibial plateau.  She presents now for operative treatment of this unstable displaced injury.  She understands the risks, benefits, the alternative treatment options and elects surgical treatment.  She specifically understands risks of bleeding, infection, nerve damage, blood clots, need for additional surgery, amputation, and death.  PROCEDURE IN DETAIL:  After preoperative consent was obtained and the correct operative site was identified, the patient was brought to the operating room and placed supine on the operating table.  General anesthesia was induced.  Preoperative antibiotics were administered. Surgical time-out was taken.  The left lower extremity was prepped and draped in standard sterile fashion with a tourniquet around the thigh. The extremity was exsanguinated and the  tourniquet was inflated to 250 mmHg.  A longitudinal incision was made over the medial aspect of the proximal tibia.  Sharp dissection was carried down through the skin and subcutaneous tissue.  Care was taken to protect the saphenous vein and the saphenous nerve.  The superficial fascia was identified.  Sartorius fascia was incised in line with the gracilis and semitendinosus tendons. They were elevated and the fracture site was identified beneath them. The Darrick Penna was applied through a small incision at the lateral tibial plateau.  It was clamped across the fracture site while manipulating the extremity into valgus at the level of the knee joint.  This reduced the fracture and held it in place while K-wires were provisionally placed across the fracture line.  AP and lateral fluoroscopic images confirmed appropriate reduction of the fracture.  Two K-wires were then inserted across the tibial plateau at the level of the subchondral bone of the medial compartment.  AP and lateral fluoroscopic images confirmed appropriate position of both K-wires.  Two 5-mm partially threaded cannulated screws were then inserted over the guidepins and tightened compressing the fracture appropriately.  A Y-shaped plate from the [employer] periarticular plate set was then selected.  It was placed over the fracture site and a buttress configuration.  It was provisionally held in place with a pin  while AP and lateral fluoroscopic images were obtained.  The plate was noted to be appropriately positioned and the fracture was appropriately reduced.  Plate was then secured distally with three bicortical 3.5-mm fully-threaded screws and two unicortical screws across the metaphyseal bone of the proximal tibia.  Final AP and lateral fluoroscopic images confirmed appropriate position and length of all hardware and appropriate reduction of the fracture site.  Wound was irrigated copiously.  The deep subcutaneous tissue was  approximated over the plate with simple sutures of 2-0 Vicryl.  The superficial subcutaneous tissue was approximated with inverted simple sutures of 3-0 Monocryl and a running 2-0 nylon was used to close the skin incision. The lateral skin incision was closed with a horizontal mattress suture of 2-0 nylon.  Sterile dressings were applied followed by a compression wrap and the knee immobilizer.  The tourniquet was released at 81 minutes after application of the dressings.  Prior to application of the dressings, 20 mL of Exparel mixed with 20 mL of normal saline, were injected into the subcutaneous tissues around the incision.  The patient was awakened from the anesthesia and transported to the recovery room in stable condition.  FOLLOWUP PLAN:  The patient will be nonweightbearing on the left lower extremity.  She will be admitted for Physical Therapy and Occupational Therapy consults.     Toni Arthurs, MD     JH/MEDQ  D:  07/25/2013  T:  07/26/2013  Job:  [id number]

## 2013-07-26 NOTE — Progress Notes (Signed)
    Subjective: 1 Day Post-Op Procedure(s) (LRB): OPEN REDUCTION INTERNAL FIXATION (ORIF) TIBIAL PLATEAU (Left) Patient reports pain as 5 on 0-10 scale.   Denies CP or SOB.  Voiding without difficulty. Positive flatus. Objective: Vital signs in last 24 hours: Temp:  [98.1 F (36.7 C)-99.3 F (37.4 C)] 98.3 F (36.8 C) (11/02 0637) Pulse Rate:  [54-113] 89 (11/02 0637) Resp:  [8-25] 18 (11/02 0637) BP: (103-138)/(47-96) 106/65 mmHg (11/02 0637) SpO2:  [93 %-99 %] 98 % (11/02 0637) Weight:  [86.183 kg (190 lb)] 86.183 kg (190 lb) (11/01 1541)  Intake/Output from previous day: 11/01 0701 - 11/02 0700 In: 4560 [P.O.:1960; I.V.:2600] Out: -  Intake/Output this shift: Total I/O In: 1000 [P.O.:1000] Out: 800 [Urine:800]  Labs: No results found for this basename: HGB,  in the last 72 hours No results found for this basename: WBC, RBC, HCT, PLT,  in the last 72 hours No results found for this basename: NA, K, CL, CO2, BUN, CREATININE, GLUCOSE, CALCIUM,  in the last 72 hours No results found for this basename: LABPT, INR,  in the last 72 hours  Physical Exam: Neurologically intact ABD soft Intact pulses distally Compartment soft dressing intact.  Assessment/Plan: 1 Day Post-Op Procedure(s) (LRB): OPEN REDUCTION INTERNAL FIXATION (ORIF) TIBIAL PLATEAU (Left) Advance diet Up with therapy Plan for discharge tomorrow Dr Victorino Dike to eval prior to d/c  [facility] D for Dr. Venita Lick [facility] [phone] 07/26/2013, 9:50 AM

## 2013-07-26 NOTE — Evaluation (Signed)
Physical Therapy Evaluation Patient Details Name: Rhonda Good MRN: [id number] DOB: 04/13/1991 Today's Date: 07/26/2013 Time: 4098-1191 PT Time Calculation (min): 20 min  PT Assessment / Plan / Recommendation History of Present Illness  Pt s/p L ORIF of tibia plateau fx  Clinical Impression  Pt 100% compliant with L LE NWB and able to amb with crutches safely with supervision. Pt with good home set up and support. Pt without insurance and would like to d/c home today. Pt safe to d/c home from PT/mobility standpoint.    PT Assessment  Patient needs continued PT services    Follow Up Recommendations  No PT follow up;Supervision/Assistance - 24 hour    Does the patient have the potential to tolerate intense rehabilitation      Barriers to Discharge        Equipment Recommendations  Crutches    Recommendations for Other Services     Frequency Min 5X/week    Precautions / Restrictions Precautions Precautions: Fall Required Braces or Orthoses: Knee Immobilizer - Left (awaiting bledsoe brace) Knee Immobilizer - Left: On at all times Restrictions Weight Bearing Restrictions: Yes LLE Weight Bearing: Non weight bearing   Pertinent Vitals/Pain 6/10 L LE pain      Mobility  Bed Mobility Bed Mobility: Supine to Sit;Sit to Supine Supine to Sit: 6: Modified independent (Device/Increase time);HOB flat Sit to Supine: 6: Modified independent (Device/Increase time);HOB flat Details for Bed Mobility Assistance: pt with good L LE management Transfers Transfers: Sit to Stand;Stand to Sit Sit to Stand: 4: Min guard;With upper extremity assist;From bed Stand to Sit: 4: Min guard;With upper extremity assist;To bed Details for Transfer Assistance: v/c's for crutch management Ambulation/Gait Ambulation/Gait Assistance: 4: Min guard Ambulation Distance (Feet): 50 Feet Assistive device: Crutches Ambulation/Gait Assistance Details: v/c's for sequencing/crutch management Gait Pattern:  Step-to pattern Gait velocity: slow General Gait Details: technique improved with practices, remains mildly unsteady - pt to have 24/7 supervision/assist upon d/c Stairs: Yes Stairs Assistance: 4: Min guard Stair Management Technique: One rail Left;Backwards;With crutches Number of Stairs: 1 (limited by IV site in wrist - onset of pain)    Exercises     PT Diagnosis: Difficulty walking;Acute pain  PT Problem List: Decreased strength;Decreased activity tolerance;Decreased knowledge of use of DME PT Treatment Interventions: DME instruction;Gait training;Stair training;Therapeutic activities;Functional mobility training;Therapeutic exercise     PT Goals(Current goals can be found in the care plan section) Acute Rehab PT Goals Patient Stated Goal: home today - pt reports she doesn't have any insurance PT Goal Formulation: With patient Time For Goal Achievement: 07/29/13 Potential to Achieve Goals: Good  Visit Information  Last PT Received On: 07/26/13 Assistance Needed: +1 History of Present Illness: Pt s/p L ORIF of tibia plateau fx       Prior Functioning  Home Living Family/patient expects to be discharged to:: Private residence Living Arrangements: Spouse/significant other Available Help at Discharge: Family;Friend(s);Available 24 hours/day Type of Home:  (motel) Home Access: Stairs to enter Secretary/administrator of Steps: 1 Entrance Stairs-Rails: None Home Layout: One level Home Equipment: None Prior Function Level of Independence: Independent Comments: doesn't work Musician: No difficulties Dominant Hand: Right    Cognition  Cognition Arousal/Alertness: Awake/alert Behavior During Therapy: WFL for tasks assessed/performed Overall Cognitive Status: Within Functional Limits for tasks assessed    Extremity/Trunk Assessment Upper Extremity Assessment Upper Extremity Assessment: Overall WFL for tasks assessed Lower Extremity Assessment Lower  Extremity Assessment: LLE deficits/detail LLE Deficits / Details: ankle/hip WFL, knee must remain  in ext via brace Cervical / Trunk Assessment Cervical / Trunk Assessment: Normal   Balance    End of Session PT - End of Session Equipment Utilized During Treatment: Gait belt;Left knee immobilizer Activity Tolerance: Patient tolerated treatment well Patient left: in bed;with call bell/phone within reach Nurse Communication: Mobility status (order for crutches)  GP     Marcene Brawn 07/26/2013, 10:59 AM  Lewis Shock, PT, DPT Pager #: [phone] Office #: [phone]

## 2013-07-26 NOTE — Evaluation (Signed)
Occupational Therapy Evaluation and Discharge Patient Details Name: Rhonda Good MRN: [id number] DOB: 12-Apr-1991 Today's Date: 07/26/2013 Time: 7829-5621 OT Time Calculation (min): 25 min  OT Assessment / Plan / Recommendation History of present illness Pt s/p L ORIF of tibia plateau fx   Clinical Impression   This 22 yo female admitted and underwent above presents to acute OT with all education completed, will D/C from acute OT.    OT Assessment  Patient does not need any further OT services    Follow Up Recommendations  No OT follow up       Equipment Recommendations  3 in 1 bedside comode          Precautions / Restrictions Precautions Precautions: Fall Required Braces or Orthoses: Knee Immobilizer - Left Knee Immobilizer - Left: On at all times Restrictions Weight Bearing Restrictions: Yes LLE Weight Bearing: Non weight bearing   Pertinent Vitals/Pain 5/10 LLE;; repositioned    ADL  Transfers/Ambulation Related to ADLs: Minguard A for all with crutches or stand pivot to [facility] ADL Comments: family/friends to A prn, issued pt a reacher     Acute Rehab OT Goals Patient Stated Goal: to go home today--"i feel like I am doing well and I do not have any insurance so I really don't want any more bills than I have to have"  Visit Information  Last OT Received On: 07/26/13 Assistance Needed: +1 History of Present Illness: Pt s/p L ORIF of tibia plateau fx       Prior Functioning     Home Living Family/patient expects to be discharged to::  (Motel) Living Arrangements: Spouse/significant other Available Help at Discharge: Family;Friend(s);Available 24 hours/day Type of Home:  (motel) Home Access: Stairs to enter Secretary/administrator of Steps: 1 Entrance Stairs-Rails: None Home Layout: One level Home Equipment: None Prior Function Level of Independence: Independent Comments: doesn't work Musician: No difficulties Dominant Hand: Right          Vision/Perception Vision - History Patient Visual Report: No change from baseline   Cognition  Cognition Arousal/Alertness: Awake/alert Behavior During Therapy: WFL for tasks assessed/performed Overall Cognitive Status: Within Functional Limits for tasks assessed    Extremity/Trunk Assessment Upper Extremity Assessment Upper Extremity Assessment: Overall WFL for tasks assessed     Mobility Bed Mobility Bed Mobility: Supine to Sit;Sit to Supine Supine to Sit: 6: Modified independent (Device/Increase time);HOB flat Sit to Supine: 6: Modified independent (Device/Increase time);HOB flat Transfers Transfers: Sit to Stand;Stand to Sit Sit to Stand: 4: Min guard;With upper extremity assist;From bed Stand to Sit: 4: Min guard;With upper extremity assist;To bed           End of Session OT - End of Session Equipment Utilized During Treatment: Left knee immobilizer Activity Tolerance: Patient tolerated treatment well Patient left: in bed;with call bell/phone within reach;with family/visitor present       Evette Georges [phone] 07/26/2013, 2:06 PM

## 2013-07-26 NOTE — Progress Notes (Signed)
Orthopedic Tech Progress Note Patient Details:  Rhonda Good 1991-08-25 [id number]  Ortho Devices Type of Ortho Device: Crutches Ortho Device/Splint Interventions: Application   Cammer, Mickie Bail 07/26/2013, 11:49 AM

## 2013-07-26 NOTE — Progress Notes (Signed)
Weekend CSW met with patient to provide medicaid application, housing resources, and offered substance abuse resources. Patient declined substance abuse resources. Patient verbalized her appreciation for CSW assistance. CSW signing off unless further social work needs arise.   Samuella Bruin, MSW, LCSWA Clinical Social Worker [facility] Emergency Dept. [phone]

## 2013-07-27 MED ORDER — ACETAMINOPHEN 325 MG PO TABS: 650.0000 mg | ORAL_TABLET | Freq: Four times a day (QID) | ORAL | Status: DC | PRN

## 2013-07-27 MED ORDER — PNEUMOCOCCAL VAC POLYVALENT 25 MCG/0.5ML IJ INJ: 0.5000 mL | INJECTION | INTRAMUSCULAR | Status: DC

## 2013-07-27 MED ORDER — DSS 100 MG PO CAPS: 100.0000 mg | ORAL_CAPSULE | Freq: Two times a day (BID) | ORAL | Status: DC

## 2013-07-27 MED ORDER — OXYCODONE HCL 5 MG PO TABS
5.0000 mg | ORAL_TABLET | ORAL | Status: DC | PRN
  Administered 2013-07-27 (×2): 15 mg via ORAL
  Filled 2013-07-27 (×2): qty 3

## 2013-07-27 MED ORDER — METHOCARBAMOL 100 MG/ML IJ SOLN: 500.0000 mg | Freq: Four times a day (QID) | INTRAVENOUS | Status: DC | PRN

## 2013-07-27 MED ORDER — SENNA 8.6 MG PO TABS: 2.0000 | ORAL_TABLET | Freq: Two times a day (BID) | ORAL | Status: DC

## 2013-07-27 MED ORDER — RIVAROXABAN 10 MG PO TABS
10.0000 mg | ORAL_TABLET | Freq: Every day | ORAL | Status: DC
  Administered 2013-07-27: 10 mg via ORAL
  Filled 2013-07-27: qty 1

## 2013-07-27 MED ORDER — METHOCARBAMOL 500 MG PO TABS: 500.0000 mg | ORAL_TABLET | Freq: Four times a day (QID) | ORAL | Status: DC | PRN

## 2013-07-27 MED ORDER — RIVAROXABAN 10 MG PO TABS: 10.0000 mg | ORAL_TABLET | Freq: Every day | ORAL | Status: DC

## 2013-07-27 MED ORDER — OXYCODONE HCL 5 MG PO TABS: 5.0000 mg | ORAL_TABLET | ORAL | Status: DC | PRN

## 2013-07-27 MED ORDER — PNEUMOCOCCAL VAC POLYVALENT 25 MCG/0.5ML IJ INJ
0.5000 mL | INJECTION | Freq: Once | INTRAMUSCULAR | Status: AC
  Administered 2013-07-27: 0.5 mL via INTRAMUSCULAR
  Filled 2013-07-27: qty 0.5

## 2013-07-27 NOTE — Discharge Summary (Signed)
Physician Discharge Summary  Patient ID: Rhonda Good MRN: [id number] DOB/AGE: 17-Nov-1990 22 y.o.  Admit date: 07/25/2013 Discharge date: 07/27/2013  Admission Diagnoses:  Smoking, left tibial plateau fracture  Discharge Diagnoses:  Same s/p ORIF of left tibial plateau fracture  Discharged Condition: stable  Hospital Course: Pt was admitted and taken to the oR for ORIF of the tibial plateau fracture.  Sh did well post op with PT and is discharged to home in stable condition.  Given her h/o smoking, she'll take xarelto 10 mg po daily for dvt prophylaxis.  Consults: None  Significant Diagnostic Studies: none  Treatments: therapies: PT and OT  Discharge Exam: Blood pressure 101/59, pulse 77, temperature 98.7 F (37.1 C), temperature source Oral, resp. rate 18, height 5\' 6"  (1.676 m), weight 86.183 kg (190 lb), last menstrual period 07/11/2013, SpO2 95.00%. wn wd female in nad.  L knee dressed and dry with knee brace in place.  nvi at left foot.  Disposition: 01-Home or Self Care  Discharge Orders   Future Orders Complete By Expires   Call MD / Call 911  As directed    Comments:     If you experience chest pain or shortness of breath, CALL 911 and be transported to the hospital emergency room.  If you develope a fever above 101 F, pus (white drainage) or increased drainage or redness at the wound, or calf pain, call your surgeon's office.   Constipation Prevention  As directed    Comments:     Drink plenty of fluids.  Prune juice may be helpful.  You may use a stool softener, such as Colace (over the counter) 100 mg twice a day.  Use MiraLax (over the counter) for constipation as needed.   Diet - low sodium heart healthy  As directed    Increase activity slowly as tolerated  As directed        Medication List         acetaminophen 325 MG tablet  Commonly known as:  TYLENOL  Take 2 tablets (650 mg total) by mouth every 6 (six) hours as needed.     DSS 100 MG Caps  Take 100  mg by mouth 2 (two) times daily.     oxyCODONE 5 MG immediate release tablet  Commonly known as:  Oxy IR/ROXICODONE  Take 1-3 tablets (5-15 mg total) by mouth every 4 (four) hours as needed for pain.     rivaroxaban 10 MG Tabs tablet  Commonly known as:  XARELTO  Take 1 tablet (10 mg total) by mouth daily.     senna 8.6 MG Tabs tablet  Commonly known as:  SENOKOT  Take 2 tablets (17.2 mg total) by mouth 2 (two) times daily.           Follow-up Information   Follow up with Donelle Hise, Jonny Ruiz, MD. Schedule an appointment as soon as possible for a visit in 2 weeks.   Specialty:  Orthopedic Surgery   Contact information:   [address] [place] [address] [phone]       Signed: Toni Good 07/27/2013, 7:59 AM

## 2013-07-27 NOTE — Progress Notes (Signed)
07/27/13 Spoke with patient about d/c, she has crutches, needs 3N1. Gave patient coupon for 30days free of xarelto. Contacted Darian with [facility] and requested 3N1 be delivered to patient prior to discharge. Jacquelynn Cree RN, BSN, CCM

## 2013-07-27 NOTE — Progress Notes (Signed)
Physical Therapy Treatment Patient Details Name: Rhonda Good MRN: [id number] DOB: 1990/10/21 Today's Date: 07/27/2013 Time: 4098-1191 PT Time Calculation (min): 23 min  PT Assessment / Plan / Recommendation  History of Present Illness Pt s/p L ORIF of tibia plateau fx   PT Comments   Pt slightly apprehensive to participate today due to she states she is hurting & she just got comfortable.  But as this therapist was outside in hallway speaking to RN about pain medication pt states she is ready to get up.  Pt reports pain is worse today than it was yesterday.   Cont'd to focus on safe amb with crutches & steps due to she states she may opt to go to her grandmother's house initially which has steps.     Follow Up Recommendations  No PT follow up;Supervision/Assistance - 24 hour     Does the patient have the potential to tolerate intense rehabilitation     Barriers to Discharge        Equipment Recommendations  Crutches    Recommendations for Other Services    Frequency Min 5X/week   Progress towards PT Goals Progress towards PT goals: Progressing toward goals  Plan Current plan remains appropriate    Precautions / Restrictions Precautions Precautions: Fall Required Braces or Orthoses: Knee Immobilizer - Left Knee Immobilizer - Left: On at all times Restrictions Weight Bearing Restrictions: Yes LLE Weight Bearing: Non weight bearing   Pertinent Vitals/Pain C/o LLE pain.  Premedicated.  Pt tearful due to pain at beginning of session but she also states she is an "emotional wreck" because she hasn't had her depression medication.      Mobility  Bed Mobility Bed Mobility: Supine to Sit;Sitting - Scoot to Edge of Bed;Sit to Supine Supine to Sit: 6: Modified independent (Device/Increase time);HOB elevated Sitting - Scoot to Edge of Bed: 6: Modified independent (Device/Increase time) Sit to Supine: 6: Modified independent (Device/Increase time) Details for Bed Mobility  Assistance: Pt able to perform supine<>sit without physical (A) but then required (A) to lift LLE back onto pillows  Transfers Transfers: Sit to Stand;Stand to Sit Sit to Stand: 4: Min guard;With upper extremity assist;With armrests;From chair/3-in-1;From bed Stand to Sit: 4: Min guard;With upper extremity assist;With armrests;To bed;To chair/3-in-1 Details for Transfer Assistance: cues for crutch management Ambulation/Gait Ambulation/Gait Assistance: 4: Min guard Ambulation Distance (Feet): 50 Feet Assistive device: Crutches Ambulation/Gait Assistance Details: Cues to ensure balance before taking next hop & safe crutch advancement.  Pt states pain worse today than yesterday Gait Pattern: Step-to pattern Gait velocity: slow General Gait Details: technique improved with practices, remains mildly unsteady - pt to have 24/7 supervision/assist upon d/c Stairs: Yes Stairs Assistance: 4: Min guard Stair Management Technique: One rail Left;Step to pattern;Backwards;With crutches Number of Stairs: 2 Wheelchair Mobility Wheelchair Mobility: No      PT Goals (current goals can now be found in the care plan section) Acute Rehab PT Goals Patient Stated Goal: to go home PT Goal Formulation: With patient Time For Goal Achievement: 07/29/13 Potential to Achieve Goals: Good  Visit Information  Last PT Received On: 07/27/13 Assistance Needed: +1 History of Present Illness: Pt s/p L ORIF of tibia plateau fx    Subjective Data  Subjective: "I dont want to hurt anymore" Patient Stated Goal: to go home   Cognition  Cognition Arousal/Alertness: Awake/alert Behavior During Therapy: WFL for tasks assessed/performed (tearful due to pain & pt states she hasn't had her depression medication) Overall Cognitive Status: Within  Functional Limits for tasks assessed    Balance     End of Session PT - End of Session Equipment Utilized During Treatment: Gait belt;Left knee immobilizer Activity  Tolerance: Patient limited by pain Patient left: in bed;with call bell/phone within reach Nurse Communication: Mobility status   GP     Lara Mulch 07/27/2013, 10:44 AM   Verdell Face, PTA [phone] 07/27/2013

## 2013-07-28 ENCOUNTER — Encounter (HOSPITAL_COMMUNITY): Payer: Self-pay | Admitting: Orthopedic Surgery

## 2013-07-29 ENCOUNTER — Encounter (HOSPITAL_COMMUNITY): Payer: Self-pay | Admitting: Orthopedic Surgery

## 2013-08-10 ENCOUNTER — Ambulatory Visit (HOSPITAL_COMMUNITY)
Admission: RE | Admit: 2013-08-10 | Discharge: 2013-08-10 | Disposition: A | Discharge status: Home / Self Care | Payer: Self-pay | Source: Ambulatory Visit | Attending: Orthopedic Surgery | Admitting: Orthopedic Surgery

## 2013-08-10 DIAGNOSIS — M79609 Pain in unspecified limb: Secondary | ICD-10-CM | POA: Insufficient documentation

## 2013-08-10 DIAGNOSIS — M25562 Pain in left knee: Secondary | ICD-10-CM

## 2013-08-10 DIAGNOSIS — Z9889 Other specified postprocedural states: Secondary | ICD-10-CM | POA: Insufficient documentation

## 2013-08-10 NOTE — Progress Notes (Signed)
*  PRELIMINARY RESULTS* Vascular Ultrasound Left lower extremity venous duplex has been completed.  Preliminary findings: no evidence of DVT  No number given to call report. After business hours. Let patient leave since study was negative.   Farrel Demark, RDMS, RVT  08/10/2013, 6:05 PM

## 2013-08-11 ENCOUNTER — Other Ambulatory Visit (HOSPITAL_COMMUNITY): Payer: Self-pay | Admitting: Orthopedic Surgery

## 2013-08-11 DIAGNOSIS — M25562 Pain in left knee: Secondary | ICD-10-CM

## 2013-12-03 ENCOUNTER — Emergency Department (HOSPITAL_COMMUNITY): Payer: Self-pay

## 2013-12-03 ENCOUNTER — Encounter (HOSPITAL_COMMUNITY): Payer: Self-pay | Admitting: Emergency Medicine

## 2013-12-03 ENCOUNTER — Emergency Department (HOSPITAL_COMMUNITY)
Admission: EM | Admit: 2013-12-03 | Discharge: 2013-12-03 | Disposition: A | Discharge status: Home / Self Care | Payer: Self-pay | Attending: Emergency Medicine | Admitting: Emergency Medicine

## 2013-12-03 DIAGNOSIS — F17200 Nicotine dependence, unspecified, uncomplicated: Secondary | ICD-10-CM | POA: Insufficient documentation

## 2013-12-03 DIAGNOSIS — Z7901 Long term (current) use of anticoagulants: Secondary | ICD-10-CM | POA: Insufficient documentation

## 2013-12-03 DIAGNOSIS — J40 Bronchitis, not specified as acute or chronic: Secondary | ICD-10-CM | POA: Insufficient documentation

## 2013-12-03 DIAGNOSIS — Z79899 Other long term (current) drug therapy: Secondary | ICD-10-CM | POA: Insufficient documentation

## 2013-12-03 DIAGNOSIS — G8929 Other chronic pain: Secondary | ICD-10-CM | POA: Insufficient documentation

## 2013-12-03 MED ORDER — ALBUTEROL SULFATE HFA 108 (90 BASE) MCG/ACT IN AERS
2.0000 | INHALATION_SPRAY | RESPIRATORY_TRACT | Status: DC | PRN
Start: 2013-12-03 — End: 2013-12-03
  Administered 2013-12-03: 2 via RESPIRATORY_TRACT
  Filled 2013-12-03: qty 6.7

## 2013-12-03 MED ORDER — ACETAMINOPHEN 500 MG PO TABS
1000.0000 mg | ORAL_TABLET | Freq: Once | ORAL | Status: AC
  Administered 2013-12-03: 1000 mg via ORAL
  Filled 2013-12-03: qty 2

## 2013-12-03 MED ORDER — GUAIFENESIN 100 MG/5ML PO LIQD: 100.0000 mg | ORAL | Status: DC | PRN

## 2013-12-03 MED ORDER — ALBUTEROL SULFATE HFA 108 (90 BASE) MCG/ACT IN AERS: 1.0000 | INHALATION_SPRAY | Freq: Four times a day (QID) | RESPIRATORY_TRACT | Status: DC | PRN

## 2013-12-03 MED ORDER — PREDNISONE 20 MG PO TABS: 60.0000 mg | ORAL_TABLET | Freq: Every day | ORAL | Status: DC

## 2013-12-03 MED ORDER — ONDANSETRON 4 MG PO TBDP
4.0000 mg | ORAL_TABLET | Freq: Once | ORAL | Status: AC
  Administered 2013-12-03: 4 mg via ORAL
  Filled 2013-12-03: qty 1

## 2013-12-03 MED ORDER — ALBUTEROL SULFATE (2.5 MG/3ML) 0.083% IN NEBU
5.0000 mg | INHALATION_SOLUTION | Freq: Once | RESPIRATORY_TRACT | Status: AC
  Administered 2013-12-03: 5 mg via RESPIRATORY_TRACT
  Filled 2013-12-03: qty 6

## 2013-12-03 MED ORDER — PREDNISONE 50 MG PO TABS
60.0000 mg | ORAL_TABLET | Freq: Once | ORAL | Status: AC
  Administered 2013-12-03: 60 mg via ORAL
  Filled 2013-12-03 (×2): qty 1

## 2013-12-03 NOTE — Discharge Instructions (Signed)

## 2013-12-03 NOTE — ED Notes (Signed)
Pt. Reports cough, vomiting, nausea, denies diarrhea. Pt. Reports chest congestion and shortness of breath. Pt. Reports chills. Pt. Reports symptoms started yesterday. Pt. In no acute distress.

## 2013-12-03 NOTE — ED Provider Notes (Addendum)
CSN: [id number]     Arrival date & time 12/03/13  0119 History   First MD Initiated Contact with Patient 12/03/13 0121     Chief Complaint  Patient presents with  . Nausea  . Emesis     (Consider location/radiation/quality/duration/timing/severity/associated sxs/prior Treatment) HPI History provided by patient. History of chronic pain. Presents with cough, headache, bodyaches, congestion and difficulty breathing. She is a smoker, denies history of asthma, complains of wheezing. Has had one bout of nausea vomiting but no diarrhea. No abdominal pain. No blood in emesis or stools. No productive sputum. No hemoptysis. Symptoms moderate in severity.  Past Medical History  Diagnosis Date  . Chronic back pain    Past Surgical History  Procedure Laterality Date  . Mouth surgery    . Hand surgery    . Orif tibia plateau Left 07/25/2013    Procedure: OPEN REDUCTION INTERNAL FIXATION (ORIF) TIBIAL PLATEAU;  Surgeon: Toni Arthurs, MD;  Location: [facility] OR;  Service: Orthopedics;  Laterality: Left;   No family history on file. History  Substance Use Topics  . Smoking status: Current Every Day Smoker -- 1.00 packs/day    Types: Cigarettes  . Smokeless tobacco: Not on file  . Alcohol Use: 0.0 oz/week     Comment: last night,    OB History   Grav Para Term Preterm Abortions TAB SAB Ect Mult Living                 Review of Systems  Constitutional: Positive for fever and chills.  HENT: Positive for congestion and sore throat.   Eyes: Negative for visual disturbance.  Respiratory: Positive for cough, shortness of breath and wheezing.   Gastrointestinal: Negative for abdominal pain and blood in stool.  Genitourinary: Negative for dysuria.  Musculoskeletal: Negative for neck stiffness.  Skin: Negative for rash.  Neurological: Negative for weakness and numbness.  All other systems reviewed and are negative.      Allergies  Hydrocodone and Naproxen  Home Medications   Current  Outpatient Rx  Name  Route  Sig  Dispense  Refill  . acetaminophen (TYLENOL) 325 MG tablet   Oral   Take 2 tablets (650 mg total) by mouth every 6 (six) hours as needed.         . docusate sodium 100 MG CAPS   Oral   Take 100 mg by mouth 2 (two) times daily.   30 capsule   0   . oxyCODONE (OXY IR/ROXICODONE) 5 MG immediate release tablet   Oral   Take 1-3 tablets (5-15 mg total) by mouth every 4 (four) hours as needed for pain.   50 tablet   0   . rivaroxaban (XARELTO) 10 MG TABS tablet   Oral   Take 1 tablet (10 mg total) by mouth daily.   12 tablet   0   . senna (SENOKOT) 8.6 MG TABS tablet   Oral   Take 2 tablets (17.2 mg total) by mouth 2 (two) times daily.   60 each   0    BP 123/99  Pulse 94  Temp(Src) 100.1 F (37.8 C) (Oral)  Resp 18  Ht 5\' 6"  (1.676 m)  Wt 200 lb (90.719 kg)  BMI 32.30 kg/m2  SpO2 95%  LMP 11/27/2013 Physical Exam  Constitutional: She is oriented to person, place, and time. She appears well-developed and well-nourished.  HENT:  Head: Normocephalic and atraumatic.  Mouth/Throat: Oropharynx is clear and moist. No oropharyngeal exudate.  Eyes: EOM are  normal. Pupils are equal, round, and reactive to light.  Neck: Neck supple.  Cardiovascular: Normal rate, regular rhythm and intact distal pulses.   Pulmonary/Chest: No stridor.  Mildly decreased air movement with bilateral expiratory wheezes, no accessory muscle use or respiratory distress  Abdominal: Soft. She exhibits no distension. There is no tenderness.  Musculoskeletal: Normal range of motion. She exhibits no edema and no tenderness.  Neurological: She is alert and oriented to person, place, and time. No cranial nerve deficit.  Skin: Skin is warm and dry.    ED Course  Procedures (including critical care time) Labs Review Labs Reviewed - No data to display Imaging Review Dg Chest 2 View  12/03/2013   CLINICAL DATA Cough, fever  EXAM CHEST  2 VIEW  COMPARISON 01/29/2023   FINDINGS Cardiomediastinal contours are within normal range. No confluent airspace opacity, pleural effusion, or pneumothorax. Mild anterior wedge deformity of an upper lumbar vertebral body is similar to prior.  IMPRESSION No radiographic evidence of an acute cardiopulmonary process.  SIGNATURE  Electronically Signed   By: Jearld LeschAndrew  DelGaizo M.D.   On: 12/03/2013 02:23   Room air pulse ox 95% is adequate  Albuterol breathing treatment. Prednisone provided. Tylenol and Zofran  3:18 AM on recheck chest tightness gone, breathing and wheezing feels improved. Lungs sounds - moving good air, min exp wheezes. Patient feels comfortable plan discharge home. Albuterol inhaler provided. Prescription for prednisone and Robitussin. Bronchitis precautions verbalized as understood.  MDM   Diagnosis: Bronchitis  Presents with fever and wheezing, bodyaches and cough Improved with medications provided. Chest x-ray obtained and reviewed as above, no infiltrate Vital signs and nurses notes reviewed and considered - stable and appropriate for discharge home.     Sunnie NielsenBrian Daaron Dimarco, MD 12/03/13 [phone]  Sunnie NielsenBrian Jasamine Pottinger, MD 12/03/13 [phone]

## 2013-12-17 ENCOUNTER — Ambulatory Visit: Payer: Self-pay | Admitting: Obstetrics and Gynecology

## 2013-12-18 ENCOUNTER — Encounter: Payer: Self-pay | Admitting: Obstetrics and Gynecology

## 2013-12-18 ENCOUNTER — Ambulatory Visit (INDEPENDENT_AMBULATORY_CARE_PROVIDER_SITE_OTHER): Payer: Self-pay | Admitting: Obstetrics and Gynecology

## 2013-12-18 VITALS — BP 100/64 | Ht 66.0 in | Wt 189.0 lb

## 2013-12-18 DIAGNOSIS — R87619 Unspecified abnormal cytological findings in specimens from cervix uteri: Secondary | ICD-10-CM

## 2013-12-18 NOTE — Patient Instructions (Signed)
Visit [email or web address] to monitor ovulation

## 2013-12-18 NOTE — Progress Notes (Signed)
This chart was scribed by Leone PayorSonum Patel, Medical Scribe, for Dr. Christin BachJohn Cheril Slattery on 12/18/13 at 12:35 PM. This chart was reviewed by Dr. Christin BachJohn Verania Salberg and is accurate.   [facility] Visit  Patient name: Rhonda Good MRN [id number]  Date of birth: 01/09/1991  CC & HPI:  Rhonda Good is a 23 y.o. female presenting today for follow up after an abnormal pap smear at the health dept several years ago. She has not had a follow up pap smear since then.   She is also having vaginal discharge and would like to be checked for STD's. States she has been with the same partner for the past 1.5 years.   She reports having difficulty getting pregnant. She has been trying for more than 6 months without birth control use. She has history of tubal pregnancy and was given Methotrexate   ROS:  Negative except noted above   Pertinent History Reviewed:  Medical & Surgical Hx:  Reviewed: Significant for   Past Medical History  Diagnosis Date  . Chronic back pain   . Ovarian cyst   . Abnormal Pap smear of cervix     Past Surgical History  Procedure Laterality Date  . Mouth surgery    . Hand surgery    . Orif tibia plateau Left 07/25/2013    Procedure: OPEN REDUCTION INTERNAL FIXATION (ORIF) TIBIAL PLATEAU;  Surgeon: Toni ArthursJohn Hewitt, MD;  Location: [facility] OR;  Service: Orthopedics;  Laterality: Left;    Medications: Reviewed & Updated - see associated section Social History: Reviewed -  reports that she has been smoking Cigarettes.  She has a 10 pack-year smoking history. She has never used smokeless tobacco.  Objective Findings:  Vitals: BP 100/64  Ht 5\' 6"  (1.676 m)  Wt 189 lb (85.73 kg)  BMI 30.52 kg/m2  LMP 12/17/2013  Physical Examination: General appearance - alert, well appearing, and in no distress and oriented to person, place, and time Pelvic - VULVA: normal appearing vulva with no masses, tenderness or lesions,  VAGINA: normal appearing vagina with normal color and discharge, no lesions,   CERVIX: normal appearing cervix without discharge or lesions,  UTERUS: uterus is normal size, shape, consistency and nontender,  ADNEXA: normal adnexa in size, nontender and no masses   Assessment & Plan:   A: 1. History of abnormal pap without follow up 2. Fertility concerns without workup.   P: 1. Refer back to health dept for pap smear and STD screening  2. Given website info ([email or web address])

## 2013-12-23 ENCOUNTER — Other Ambulatory Visit: Payer: Self-pay | Admitting: Obstetrics and Gynecology

## 2014-02-09 ENCOUNTER — Emergency Department (HOSPITAL_COMMUNITY)
Admission: EM | Admit: 2014-02-09 | Discharge: 2014-02-09 | Disposition: A | Discharge status: Home / Self Care | Payer: Self-pay | Attending: Emergency Medicine | Admitting: Emergency Medicine

## 2014-02-09 ENCOUNTER — Encounter (HOSPITAL_COMMUNITY): Payer: Self-pay | Admitting: Emergency Medicine

## 2014-02-09 DIAGNOSIS — R42 Dizziness and giddiness: Secondary | ICD-10-CM | POA: Insufficient documentation

## 2014-02-09 DIAGNOSIS — Z7901 Long term (current) use of anticoagulants: Secondary | ICD-10-CM | POA: Insufficient documentation

## 2014-02-09 DIAGNOSIS — H60399 Other infective otitis externa, unspecified ear: Secondary | ICD-10-CM | POA: Insufficient documentation

## 2014-02-09 DIAGNOSIS — H6090 Unspecified otitis externa, unspecified ear: Secondary | ICD-10-CM

## 2014-02-09 DIAGNOSIS — Z8742 Personal history of other diseases of the female genital tract: Secondary | ICD-10-CM | POA: Insufficient documentation

## 2014-02-09 DIAGNOSIS — G8929 Other chronic pain: Secondary | ICD-10-CM | POA: Insufficient documentation

## 2014-02-09 DIAGNOSIS — F17200 Nicotine dependence, unspecified, uncomplicated: Secondary | ICD-10-CM | POA: Insufficient documentation

## 2014-02-09 DIAGNOSIS — Z3202 Encounter for pregnancy test, result negative: Secondary | ICD-10-CM | POA: Insufficient documentation

## 2014-02-09 DIAGNOSIS — R1110 Vomiting, unspecified: Secondary | ICD-10-CM | POA: Insufficient documentation

## 2014-02-09 DIAGNOSIS — Z79899 Other long term (current) drug therapy: Secondary | ICD-10-CM | POA: Insufficient documentation

## 2014-02-09 DIAGNOSIS — IMO0002 Reserved for concepts with insufficient information to code with codable children: Secondary | ICD-10-CM | POA: Insufficient documentation

## 2014-02-09 LAB — URINALYSIS, ROUTINE W REFLEX MICROSCOPIC
Glucose, UA: NEGATIVE mg/dL
Hgb urine dipstick: NEGATIVE
NITRITE: NEGATIVE
PH: 6 (ref 5.0–8.0)
PROTEIN: NEGATIVE mg/dL
Specific Gravity, Urine: 1.025 (ref 1.005–1.030)
Urobilinogen, UA: 0.2 mg/dL (ref 0.0–1.0)

## 2014-02-09 LAB — URINE MICROSCOPIC-ADD ON

## 2014-02-09 LAB — PREGNANCY, URINE: Preg Test, Ur: NEGATIVE

## 2014-02-09 MED ORDER — MECLIZINE HCL 12.5 MG PO TABS
25.0000 mg | ORAL_TABLET | Freq: Once | ORAL | Status: AC
  Administered 2014-02-09: 25 mg via ORAL
  Filled 2014-02-09: qty 2

## 2014-02-09 MED ORDER — CIPROFLOXACIN HCL 500 MG PO TABS: 500.0000 mg | ORAL_TABLET | Freq: Two times a day (BID) | ORAL | Status: DC

## 2014-02-09 MED ORDER — ONDANSETRON 4 MG PO TBDP
4.0000 mg | ORAL_TABLET | Freq: Once | ORAL | Status: AC
  Administered 2014-02-09: 4 mg via ORAL
  Filled 2014-02-09: qty 1

## 2014-02-09 MED ORDER — MECLIZINE HCL 50 MG PO TABS: 50.0000 mg | ORAL_TABLET | Freq: Three times a day (TID) | ORAL | Status: DC | PRN

## 2014-02-09 MED ORDER — NEOMYCIN-POLYMYXIN-HC 3.5-10000-1 OT SUSP: 4.0000 [drp] | Freq: Three times a day (TID) | OTIC | Status: DC

## 2014-02-09 MED ORDER — ONDANSETRON 4 MG PO TBDP: 4.0000 mg | ORAL_TABLET | Freq: Three times a day (TID) | ORAL | Status: DC | PRN

## 2014-02-09 NOTE — ED Notes (Signed)
Patient given discharge instruction, verbalized understand. Patient ambulatory out of the department.  

## 2014-02-09 NOTE — Discharge Instructions (Signed)
Otitis Externa Otitis externa is a bacterial or fungal infection of the outer ear canal. This is the area from the eardrum to the outside of the ear. Otitis externa is sometimes called "swimmer's ear." CAUSES  Possible causes of infection include:  Swimming in dirty water.  Moisture remaining in the ear after swimming or bathing.  Mild injury (trauma) to the ear.  Objects stuck in the ear (foreign body).  Cuts or scrapes (abrasions) on the outside of the ear. SYMPTOMS  The first symptom of infection is often itching in the ear canal. Later signs and symptoms may include swelling and redness of the ear canal, ear pain, and yellowish-white fluid (pus) coming from the ear. The ear pain may be worse when pulling on the earlobe. DIAGNOSIS  Your caregiver will perform a physical exam. A sample of fluid may be taken from the ear and examined for bacteria or fungi. TREATMENT  Antibiotic ear drops are often given for 10 to 14 days. Treatment may also include pain medicine or corticosteroids to reduce itching and swelling. PREVENTION   Keep your ear dry. Use the corner of a towel to absorb water out of the ear canal after swimming or bathing.  Avoid scratching or putting objects inside your ear. This can damage the ear canal or remove the protective wax that lines the canal. This makes it easier for bacteria and fungi to grow.  Avoid swimming in lakes, polluted water, or poorly chlorinated pools.  You may use ear drops made of rubbing alcohol and vinegar after swimming. Combine equal parts of white vinegar and alcohol in a bottle. Put 3 or 4 drops into each ear after swimming. HOME CARE INSTRUCTIONS   Apply antibiotic ear drops to the ear canal as prescribed by your caregiver.  Only take over-the-counter or prescription medicines for pain, discomfort, or fever as directed by your caregiver.  If you have diabetes, follow any additional treatment instructions from your caregiver.  Keep all  follow-up appointments as directed by your caregiver. SEEK MEDICAL CARE IF:   You have a fever.  Your ear is still red, swollen, painful, or draining pus after 3 days.  Your redness, swelling, or pain gets worse.  You have a severe headache.  You have redness, swelling, pain, or tenderness in the area behind your ear. MAKE SURE YOU:   Understand these instructions.  Will watch your condition.  Will get help right away if you are not doing well or get worse. Document Released: 09/10/2005 Document Revised: 12/03/2011 Document Reviewed: 09/27/2011 [facility] Patient Information 2014 [place], [place].  Vertigo Vertigo means you feel like you are moving when you are not. Vertigo can make you feel like things around you are moving when they are not. This problem often goes away on its own.  HOME CARE   Follow your doctor's instructions.  Avoid driving.  Avoid using heavy machinery.  Avoid doing any activity that could be dangerous if you have a vertigo attack.  Tell your doctor if a medicine seems to cause your vertigo. GET HELP RIGHT AWAY IF:   Your medicines do not help or make you feel worse.  You have trouble talking or walking.  You feel weak or have trouble using your arms, hands, or legs.  You have bad headaches.  You keep feeling sick to your stomach (nauseous) or throwing up (vomiting).  Your vision changes.  A family member notices changes in your behavior.  Your problems get worse. MAKE SURE YOU:  Understand  these instructions.  Will watch your condition.  Will get help right away if you are not doing well or get worse. Document Released: 06/19/2008 Document Revised: 12/03/2011 Document Reviewed: 03/29/2011 [facility] Patient Information 2014 [place], [place].  Nausea and Vomiting Nausea means you feel sick to your stomach. Throwing up (vomiting) is a reflex where stomach contents come out of your mouth. HOME CARE   Take medicine as told by your  doctor.  Do not force yourself to eat. However, you do need to drink fluids.  If you feel like eating, eat a normal diet as told by your doctor.  Eat rice, wheat, potatoes, bread, lean meats, yogurt, fruits, and vegetables.  Avoid high-fat foods.  Drink enough fluids to keep your pee (urine) clear or pale yellow.  Ask your doctor how to replace body fluid losses (rehydrate). Signs of body fluid loss (dehydration) include:  Feeling very thirsty.  Dry lips and mouth.  Feeling dizzy.  Dark pee.  Peeing less than normal.  Feeling confused.  Fast breathing or heart rate. GET HELP RIGHT AWAY IF:   You have blood in your throw up.  You have black or bloody poop (stool).  You have a bad headache or stiff neck.  You feel confused.  You have bad belly (abdominal) pain.  You have chest pain or trouble breathing.  You do not pee at least once every 8 hours.  You have cold, clammy skin.  You keep throwing up after 24 to 48 hours.  You have a fever. MAKE SURE YOU:   Understand these instructions.  Will watch your condition.  Will get help right away if you are not doing well or get worse. Document Released: 02/27/2008 Document Revised: 12/03/2011 Document Reviewed: 02/09/2011 [facility] Patient Information 2014 [place], [place].

## 2014-02-09 NOTE — ED Notes (Signed)
Pt with N/V since this morning, right ear drainage x 1 month per pt and painful now, states hearing is muffled

## 2014-02-09 NOTE — ED Provider Notes (Signed)
CSN: [id number]     Arrival date & time 02/09/14  1518 History   First MD Initiated Contact with Patient 02/09/14 1525     Chief Complaint  Patient presents with  . Emesis  . Otalgia      HPI  Pt reports drainage and pain from the right ear for the last one month. Her hearing is muffled the drains "thick cloudy liquid". She has some dizziness today while at work and some nausea. Vomited yesterday morning. Vomited this morning. Ports intermittent spinning sensation or vertigo with head movement and when standing. Not consistently.  Past Medical History  Diagnosis Date  . Chronic back pain   . Ovarian cyst   . Abnormal Pap smear of cervix    Past Surgical History  Procedure Laterality Date  . Mouth surgery    . Hand surgery    . Orif tibia plateau Left 07/25/2013    Procedure: OPEN REDUCTION INTERNAL FIXATION (ORIF) TIBIAL PLATEAU;  Surgeon: Toni ArthursJohn Hewitt, MD;  Location: [facility] OR;  Service: Orthopedics;  Laterality: Left;   Family History  Problem Relation Age of Onset  . Diabetes Mother   . Hyperlipidemia Mother   . Diabetes Maternal Aunt   . Diabetes Maternal Uncle   . Cancer Maternal Grandfather   . Diabetes Paternal Grandmother    History  Substance Use Topics  . Smoking status: Current Every Day Smoker -- 1.00 packs/day for 10 years    Types: Cigarettes  . Smokeless tobacco: Never Used  . Alcohol Use: No     Comment: once a week    OB History   Grav Para Term Preterm Abortions TAB SAB Ect Mult Living                 Review of Systems  Constitutional: Negative for fever, chills, diaphoresis, appetite change and fatigue.  HENT: Positive for ear discharge, ear pain and hearing loss. Negative for facial swelling, mouth sores, sore throat and trouble swallowing.   Eyes: Negative for visual disturbance.  Respiratory: Negative for cough, chest tightness, shortness of breath and wheezing.   Cardiovascular: Negative for chest pain.  Gastrointestinal: Positive for nausea.  Negative for vomiting, abdominal pain, diarrhea and abdominal distention.  Endocrine: Negative for polydipsia, polyphagia and polyuria.  Genitourinary: Negative for dysuria, frequency and hematuria.  Musculoskeletal: Negative for gait problem.  Skin: Negative for color change, pallor and rash.  Neurological: Positive for dizziness. Negative for syncope, light-headedness and headaches.  Hematological: Does not bruise/bleed easily.  Psychiatric/Behavioral: Negative for behavioral problems and confusion.      Allergies  Hydrocodone and Naproxen  Home Medications   Prior to Admission medications   Medication Sig Start Date End Date Taking? Authorizing Provider  acetaminophen (TYLENOL) 325 MG tablet Take 2 tablets (650 mg total) by mouth every 6 (six) hours as needed. 07/27/13   Toni ArthursJohn Hewitt, MD  albuterol (PROVENTIL HFA;VENTOLIN HFA) 108 (90 BASE) MCG/ACT inhaler Inhale 1-2 puffs into the lungs every 6 (six) hours as needed for wheezing or shortness of breath. 12/03/13   Sunnie NielsenBrian Opitz, MD  docusate sodium 100 MG CAPS Take 100 mg by mouth 2 (two) times daily. 07/27/13   Toni ArthursJohn Hewitt, MD  guaiFENesin (ROBITUSSIN) 100 MG/5ML liquid Take 5-10 mLs (100-200 mg total) by mouth every 4 (four) hours as needed for cough. 12/03/13   Sunnie NielsenBrian Opitz, MD  oxyCODONE (OXY IR/ROXICODONE) 5 MG immediate release tablet Take 1-3 tablets (5-15 mg total) by mouth every 4 (four) hours as needed for pain.  07/27/13   Toni ArthursJohn Hewitt, MD  predniSONE (DELTASONE) 20 MG tablet Take 3 tablets (60 mg total) by mouth daily. 12/03/13   Sunnie NielsenBrian Opitz, MD  rivaroxaban (XARELTO) 10 MG TABS tablet Take 1 tablet (10 mg total) by mouth daily. 07/27/13   Toni ArthursJohn Hewitt, MD  senna (SENOKOT) 8.6 MG TABS tablet Take 2 tablets (17.2 mg total) by mouth 2 (two) times daily. 07/27/13   Toni ArthursJohn Hewitt, MD   BP 131/87  Pulse 101  Temp(Src) 98.6 F (37 C) (Oral)  Resp 20  Ht 5\' 7"  (1.702 m)  Wt 190 lb (86.183 kg)  BMI 29.75 kg/m2  SpO2 97% Physical Exam   Constitutional: She is oriented to person, place, and time. She appears well-developed and well-nourished. No distress.  HENT:  Head: Normocephalic.  Right Ear: There is drainage, swelling and tenderness. Decreased hearing is noted.  Left Ear: No drainage, swelling or tenderness. No decreased hearing is noted.   Left otitis externa. Some drainage in the canal. Canal compromise less than 50%.  No nystagmus.  Eyes: Conjunctivae are normal. Pupils are equal, round, and reactive to light. No scleral icterus.  Neck: Normal range of motion. Neck supple. No thyromegaly present.  Cardiovascular: Normal rate and regular rhythm.  Exam reveals no gallop and no friction rub.   No murmur heard. Pulmonary/Chest: Effort normal and breath sounds normal. No respiratory distress. She has no wheezes. She has no rales.  Abdominal: Soft. Bowel sounds are normal. She exhibits no distension. There is no tenderness. There is no rebound.  Musculoskeletal: Normal range of motion.  Neurological: She is alert and oriented to person, place, and time.  Skin: Skin is warm and dry. No rash noted.  Psychiatric: She has a normal mood and affect. Her behavior is normal.    ED Course  Procedures (including critical care time) Labs Review Labs Reviewed  URINALYSIS, ROUTINE W REFLEX MICROSCOPIC - Abnormal; Notable for the following:    Bilirubin Urine SMALL (*)    Ketones, ur >80 (*)    Leukocytes, UA TRACE (*)    All other components within normal limits  URINE MICROSCOPIC-ADD ON - Abnormal; Notable for the following:    Squamous Epithelial / LPF FEW (*)    All other components within normal limits  PREGNANCY, URINE    Imaging Review No results found.   EKG Interpretation None      MDM   Final diagnoses:  Otitis externa  Vertigo  Vomiting    Plan:  Check pregnancy status.  Zofran, Meclizine,Cortisporin otic drops, PO cipro. If not pregnant.    Rolland PorterMark Adair Lemar, MD 02/09/14 1740
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
## 2014-07-09 ENCOUNTER — Other Ambulatory Visit: Payer: Self-pay

## 2014-09-01 ENCOUNTER — Encounter (HOSPITAL_COMMUNITY): Payer: Self-pay | Admitting: *Deleted

## 2014-09-01 ENCOUNTER — Emergency Department (HOSPITAL_COMMUNITY)
Admission: EM | Admit: 2014-09-01 | Discharge: 2014-09-01 | Disposition: A | Discharge status: Home / Self Care | Payer: Self-pay | Attending: Emergency Medicine | Admitting: Emergency Medicine

## 2014-09-01 ENCOUNTER — Emergency Department (HOSPITAL_COMMUNITY): Payer: Self-pay

## 2014-09-01 DIAGNOSIS — T07XXXA Unspecified multiple injuries, initial encounter: Secondary | ICD-10-CM

## 2014-09-01 DIAGNOSIS — Y998 Other external cause status: Secondary | ICD-10-CM | POA: Insufficient documentation

## 2014-09-01 DIAGNOSIS — W19XXXA Unspecified fall, initial encounter: Secondary | ICD-10-CM

## 2014-09-01 DIAGNOSIS — Z720 Tobacco use: Secondary | ICD-10-CM | POA: Insufficient documentation

## 2014-09-01 DIAGNOSIS — G8929 Other chronic pain: Secondary | ICD-10-CM | POA: Insufficient documentation

## 2014-09-01 DIAGNOSIS — Z79899 Other long term (current) drug therapy: Secondary | ICD-10-CM | POA: Insufficient documentation

## 2014-09-01 DIAGNOSIS — Y92018 Other place in single-family (private) house as the place of occurrence of the external cause: Secondary | ICD-10-CM | POA: Insufficient documentation

## 2014-09-01 DIAGNOSIS — Z8742 Personal history of other diseases of the female genital tract: Secondary | ICD-10-CM | POA: Insufficient documentation

## 2014-09-01 DIAGNOSIS — W1789XA Other fall from one level to another, initial encounter: Secondary | ICD-10-CM | POA: Insufficient documentation

## 2014-09-01 DIAGNOSIS — Z7952 Long term (current) use of systemic steroids: Secondary | ICD-10-CM | POA: Insufficient documentation

## 2014-09-01 DIAGNOSIS — T07 Unspecified multiple injuries: Secondary | ICD-10-CM | POA: Insufficient documentation

## 2014-09-01 DIAGNOSIS — Z792 Long term (current) use of antibiotics: Secondary | ICD-10-CM | POA: Insufficient documentation

## 2014-09-01 DIAGNOSIS — Y9389 Activity, other specified: Secondary | ICD-10-CM | POA: Insufficient documentation

## 2014-09-01 HISTORY — DX: Unspecified asthma, uncomplicated: J45.909

## 2014-09-01 MED ORDER — IBUPROFEN 800 MG PO TABS: 800.0000 mg | ORAL_TABLET | Freq: Three times a day (TID) | ORAL | Status: DC | PRN

## 2014-09-01 MED ORDER — TRAMADOL HCL 50 MG PO TABS
50.0000 mg | ORAL_TABLET | Freq: Once | ORAL | Status: AC
Start: 2014-09-01 — End: 2014-09-01
  Administered 2014-09-01: 50 mg via ORAL
  Filled 2014-09-01: qty 1

## 2014-09-01 NOTE — ED Provider Notes (Signed)
TIME SEEN: 1:10 AM  CHIEF COMPLAINT: Fall, left wrist and knee injury  HPI: Patient is a 23 y.o. F who is right-hand dominant with history of chronic back pain who presents emergency room with left wrist and left knee pain after she reports falling off the porch night. Patient reports that the police were at her house looking for her mother who has a wart out. She states that she was looking for her mother she fell off the porch and landed on her left side. Denies hitting her head or passing out. No numbness, tingling or focal weakness. Patient does smell of alcohol reports having "one beer" tonight. No drug use.  ROS: See HPI Constitutional: no fever  Eyes: no drainage  ENT: no runny nose   Cardiovascular:  no chest pain  Resp: no SOB  GI: no vomiting GU: no dysuria Integumentary: no rash  Allergy: no hives  Musculoskeletal: no leg swelling  Neurological: no slurred speech ROS otherwise negative  PAST MEDICAL HISTORY/PAST SURGICAL HISTORY:  Past Medical History  Diagnosis Date  . Chronic back pain   . Ovarian cyst   . Abnormal Pap smear of cervix     MEDICATIONS:  Prior to Admission medications   Medication Sig Start Date End Date Taking? Authorizing Provider  acetaminophen (TYLENOL) 325 MG tablet Take 2 tablets (650 mg total) by mouth every 6 (six) hours as needed. 07/27/13   Toni ArthursJohn Hewitt, MD  albuterol (PROVENTIL HFA;VENTOLIN HFA) 108 (90 BASE) MCG/ACT inhaler Inhale 1-2 puffs into the lungs every 6 (six) hours as needed for wheezing or shortness of breath. 12/03/13   Sunnie NielsenBrian Opitz, MD  ciprofloxacin (CIPRO) 500 MG tablet Take 1 tablet (500 mg total) by mouth every 12 (twelve) hours. 02/09/14   Rolland PorterMark James, MD  meclizine (ANTIVERT) 50 MG tablet Take 1 tablet (50 mg total) by mouth 3 (three) times daily as needed. 02/09/14   Rolland PorterMark James, MD  neomycin-polymyxin-hydrocortisone (CORTISPORIN) 3.5-10000-1 otic suspension Place 4 drops into the right ear 3 (three) times daily. 02/09/14   Rolland PorterMark  James, MD  ondansetron (ZOFRAN ODT) 4 MG disintegrating tablet Take 1 tablet (4 mg total) by mouth every 8 (eight) hours as needed for nausea. 02/09/14   Rolland PorterMark James, MD  oxyCODONE (OXY IR/ROXICODONE) 5 MG immediate release tablet Take 1-3 tablets (5-15 mg total) by mouth every 4 (four) hours as needed for pain. 07/27/13   Toni ArthursJohn Hewitt, MD    ALLERGIES:  Allergies  Allergen Reactions  . Hydrocodone Hives  . Naproxen Itching and Swelling    SOCIAL HISTORY:  History  Substance Use Topics  . Smoking status: Current Every Day Smoker -- 1.00 packs/day for 10 years    Types: Cigarettes  . Smokeless tobacco: Never Used  . Alcohol Use: No     Comment: once a week     FAMILY HISTORY: Family History  Problem Relation Age of Onset  . Diabetes Mother   . Hyperlipidemia Mother   . Diabetes Maternal Aunt   . Diabetes Maternal Uncle   . Cancer Maternal Grandfather   . Diabetes Paternal Grandmother     EXAM: BP 117/64 mmHg  Pulse 98  Temp(Src) 98.4 F (36.9 C) (Oral)  Resp 18  Ht 5\' 8"  (1.727 m)  Wt 200 lb (90.719 kg)  BMI 30.42 kg/m2  SpO2 97%  LMP 07/31/2014 CONSTITUTIONAL: Alert and oriented and responds appropriately to questions. Well-appearing; well-nourished; GCS 15 HEAD: Normocephalic; atraumatic EYES: Conjunctivae clear, PERRL, EOMI ENT: normal nose; no rhinorrhea; moist  mucous membranes; pharynx without lesions noted; no dental injury; no septal hematoma NECK: Supple, no meningismus, no LAD; no midline spinal tenderness, step-off or deformity CARD: RRR; S1 and S2 appreciated; no murmurs, no clicks, no rubs, no gallops RESP: Normal chest excursion without splinting or tachypnea; breath sounds clear and equal bilaterally; no wheezes, no rhonchi, no rales; chest wall stable, nontender to palpation ABD/GI: Normal bowel sounds; non-distended; soft, non-tender, no rebound, no guarding PELVIS:  stable, nontender to palpation BACK:  The back appears normal and is non-tender to  palpation, there is no CVA tenderness; no midline spinal tenderness, step-off or deformity EXT: Tender to palpation over the left wrist and left hand without obvious deformity, also tender over the left knee diffusely with an associated abrasion and left proximal tibia, no tenderness at the fibular head, otherwise no tenderness of the left shoulder or left elbow or left hip or left ankle, compartments are soft, 2+ radial and DP pulses bilaterally, Normal ROM in all joints; otherwise extremities are non-tender to palpation; no edema; normal capillary refill; no cyanosis    SKIN: Normal color for age and race; warm NEURO: Moves all extremities equally, sensation to light touch intact diffusely, cranial nerves II through XII intact PSYCH: The patient's mood and manner are appropriate. Grooming and personal hygiene are appropriate.  MEDICAL DECISION MAKING: Patient here after she fell off of a porch hurting her left wrist and left knee. No other signs of trauma on exam. She is hemodynamically stable, neurologically intact. Does admit to drinking alcohol tonight but is not significantly intoxicated. She does smell of alcohol. Will obtain x-rays of her left wrist, left hand, left knee, left tibia and fibula. Will give tramadol for pain.  ED PROGRESS: X-rays negative. Nursing staff noted that patient ambulated to the bathroom without assistance. We'll discharge home with prescription for ibuprofen to use for pain. Suspect contusions. Discussed return precautions. She verbalized understanding and is comfortable with plan.     Layla MawKristen N Keyarra Rendall, DO 09/01/14 0214

## 2014-09-01 NOTE — ED Notes (Signed)
Patient left room and went to lobby after being told by EDP that she was being discharged. Did not esign or wait for prescription.

## 2014-09-01 NOTE — ED Notes (Signed)
Pt back to room without any problems or c/o pain, ambulated without limp or grimace.
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
## 2014-09-01 NOTE — ED Notes (Signed)
Pt to department reports falling off porch hurting left wrist and left knee.  No deformity noted.  CMS intact.  Pt reports having "one beer" tonight.

## 2014-09-01 NOTE — ED Notes (Signed)
Pt ambulating to bathroom with no c/o pain.

## 2014-09-01 NOTE — Discharge Instructions (Signed)
Contusion °A contusion is a deep bruise. Contusions are the result of an injury that caused bleeding under the skin. The contusion may turn blue, purple, or yellow. Minor injuries will give you a painless contusion, but more severe contusions may stay painful and swollen for a few weeks.  °CAUSES  °A contusion is usually caused by a blow, trauma, or direct force to an area of the body. °SYMPTOMS  °· Swelling and redness of the injured area. °· Bruising of the injured area. °· Tenderness and soreness of the injured area. °· Pain. °DIAGNOSIS  °The diagnosis can be made by taking a history and physical exam. An X-ray, CT scan, or MRI may be needed to determine if there were any associated injuries, such as fractures. °TREATMENT  °Specific treatment will depend on what area of the body was injured. In general, the best treatment for a contusion is resting, icing, elevating, and applying cold compresses to the injured area. Over-the-counter medicines may also be recommended for pain control. Ask your caregiver what the best treatment is for your contusion. °HOME CARE INSTRUCTIONS  °· Put ice on the injured area. °· Put ice in a plastic bag. °· Place a towel between your skin and the bag. °· Leave the ice on for 15-20 minutes, 3-4 times a day, or as directed by your health care provider. °· Only take over-the-counter or prescription medicines for pain, discomfort, or fever as directed by your caregiver. Your caregiver may recommend avoiding anti-inflammatory medicines (aspirin, ibuprofen, and naproxen) for 48 hours because these medicines may increase bruising. °· Rest the injured area. °· If possible, elevate the injured area to reduce swelling. °SEEK IMMEDIATE MEDICAL CARE IF:  °· You have increased bruising or swelling. °· You have pain that is getting worse. °· Your swelling or pain is not relieved with medicines. °MAKE SURE YOU:  °· Understand these instructions. °· Will watch your condition. °· Will get help right  away if you are not doing well or get worse. °Document Released: 06/20/2005 Document Revised: 09/15/2013 Document Reviewed: 07/16/2011 °[employer]® Patient Information ©2015 [employer]. This information is not intended to replace advice given to you by your health care provider. Make sure you discuss any questions you have with your health care provider. °RICE: Routine Care for Injuries °The routine care of many injuries includes Rest, Ice, Compression, and Elevation (RICE). °HOME CARE INSTRUCTIONS °· Rest is needed to allow your body to heal. Routine activities can usually be resumed when comfortable. Injured tendons and bones can take up to 6 weeks to heal. Tendons are the cord-like structures that attach muscle to bone. °· Ice following an injury helps keep the swelling down and reduces pain. °¨ Put ice in a plastic bag. °¨ Place a towel between your skin and the bag. °¨ Leave the ice on for 15-20 minutes, 3-4 times a day, or as directed by your health care provider. Do this while awake, for the first 24 to 48 hours. After that, continue as directed by your caregiver. °· Compression helps keep swelling down. It also gives support and helps with discomfort. If an elastic bandage has been applied, it should be removed and reapplied every 3 to 4 hours. It should not be applied tightly, but firmly enough to keep swelling down. Watch fingers or toes for swelling, bluish discoloration, coldness, numbness, or excessive pain. If any of these problems occur, remove the bandage and reapply loosely. Contact your caregiver if these problems continue. °· Elevation helps reduce swelling   and decreases pain. With extremities, such as the arms, hands, legs, and feet, the injured area should be placed near or above the level of the heart, if possible. °SEEK IMMEDIATE MEDICAL CARE IF: °· You have persistent pain and swelling. °· You develop redness, numbness, or unexpected weakness. °· Your symptoms are getting worse rather than  improving after several days. °These symptoms may indicate that further evaluation or further X-rays are needed. Sometimes, X-rays may not show a small broken bone (fracture) until 1 week or 10 days later. Make a follow-up appointment with your caregiver. Ask when your X-ray results will be ready. Make sure you get your X-ray results. °Document Released: 12/23/2000 Document Revised: 09/15/2013 Document Reviewed: 02/09/2011 °[employer]® Patient Information ©2015 [employer]. This information is not intended to replace advice given to you by your health care provider. Make sure you discuss any questions you have with your health care provider. ° °

## 2014-12-26 ENCOUNTER — Emergency Department (HOSPITAL_COMMUNITY)
Admission: EM | Admit: 2014-12-26 | Discharge: 2014-12-26 | Disposition: A | Discharge status: Home / Self Care | Payer: Self-pay | Attending: Emergency Medicine | Admitting: Emergency Medicine

## 2014-12-26 ENCOUNTER — Encounter (HOSPITAL_COMMUNITY): Payer: Self-pay | Admitting: Emergency Medicine

## 2014-12-26 DIAGNOSIS — J45909 Unspecified asthma, uncomplicated: Secondary | ICD-10-CM | POA: Insufficient documentation

## 2014-12-26 DIAGNOSIS — Y998 Other external cause status: Secondary | ICD-10-CM | POA: Insufficient documentation

## 2014-12-26 DIAGNOSIS — Z8742 Personal history of other diseases of the female genital tract: Secondary | ICD-10-CM | POA: Insufficient documentation

## 2014-12-26 DIAGNOSIS — Y9289 Other specified places as the place of occurrence of the external cause: Secondary | ICD-10-CM | POA: Insufficient documentation

## 2014-12-26 DIAGNOSIS — Z79899 Other long term (current) drug therapy: Secondary | ICD-10-CM | POA: Insufficient documentation

## 2014-12-26 DIAGNOSIS — Y9389 Activity, other specified: Secondary | ICD-10-CM | POA: Insufficient documentation

## 2014-12-26 DIAGNOSIS — G8929 Other chronic pain: Secondary | ICD-10-CM | POA: Insufficient documentation

## 2014-12-26 DIAGNOSIS — Z720 Tobacco use: Secondary | ICD-10-CM | POA: Insufficient documentation

## 2014-12-26 DIAGNOSIS — S0990XA Unspecified injury of head, initial encounter: Secondary | ICD-10-CM

## 2014-12-26 DIAGNOSIS — S0181XA Laceration without foreign body of other part of head, initial encounter: Secondary | ICD-10-CM | POA: Insufficient documentation

## 2014-12-26 MED ORDER — LIDOCAINE-EPINEPHRINE (PF) 1 %-1:200000 IJ SOLN
30.0000 mL | Freq: Once | INTRAMUSCULAR | Status: AC
  Administered 2014-12-26: 30 mL

## 2014-12-26 MED ORDER — LIDOCAINE-EPINEPHRINE (PF) 1 %-1:200000 IJ SOLN: 20.0000 mL | Freq: Once | INTRAMUSCULAR | Status: DC

## 2014-12-26 MED ORDER — LIDOCAINE-EPINEPHRINE (PF) 1 %-1:200000 IJ SOLN
INTRAMUSCULAR | Status: AC
  Filled 2014-12-26: qty 10

## 2014-12-26 MED ORDER — OXYCODONE-ACETAMINOPHEN 5-325 MG PO TABS
2.0000 | ORAL_TABLET | Freq: Once | ORAL | Status: AC
  Administered 2014-12-26: 2 via ORAL
  Filled 2014-12-26: qty 2

## 2014-12-26 NOTE — ED Notes (Signed)
EDP requesting that we try and find pt a ride home. I asked [employer] and they called [employer] and they are to let me know as soon as they find out.
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
## 2014-12-26 NOTE — Discharge Instructions (Signed)
°Emergency Department Resource Guide °1) Find a Doctor and Pay Out of Pocket °Although you won't have to find out who is covered by your insurance plan, it is a good idea to ask around and get recommendations. You will then need to call the office and see if the doctor you have chosen will accept you as a new patient and what types of options they offer for patients who are self-pay. Some doctors offer discounts or will set up payment plans for their patients who do not have insurance, but you will need to ask so you aren't surprised when you get to your appointment. ° °2) Contact Your Local Health Department °Not all health departments have doctors that can see patients for sick visits, but many do, so it is worth a call to see if yours does. If you don't know where your local health department is, you can check in your phone book. The CDC also has a tool to help you locate your state's health department, and many state websites also have listings of all of their local health departments. ° °3) Find a Walk-in Clinic °If your illness is not likely to be very severe or complicated, you may want to try a walk in clinic. These are popping up all over the country in pharmacies, drugstores, and shopping centers. They're usually staffed by nurse practitioners or physician assistants that have been trained to treat common illnesses and complaints. They're usually fairly quick and inexpensive. However, if you have serious medical issues or chronic medical problems, these are probably not your best option. ° °No Primary Care Doctor: °- Call Health Connect at  832-8000 - they can help you locate a primary care doctor that  accepts your insurance, provides certain services, etc. °- Physician Referral Service- 1-800-533-3463 ° °Chronic Pain Problems: °Organization         Address  Phone   Notes  °[facility]  (336) 297-2271 Patients need to be referred by their primary care doctor.  ° °Medication  Assistance: °Organization         Address  Phone   Notes  °[place] [facility] [address] °[place], [place] [address] (336) 641-8030 --Must be a resident of [place] °-- Must have NO insurance coverage whatsoever (no Medicaid/ Medicare, etc.) °-- The pt. MUST have a primary care doctor that directs their care regularly and follows them in the community °  °[facility]  (866) 331-1348   °[employer]  (888) 892-1162   ° °Agencies that provide inexpensive medical care: °Organization         Address  Phone   Notes  °[facility]  (336) 832-8035   °[facility]    (336) 832-7272   °[facility] [address] °[place], [place] [address] (336) 832-4777   °[facility] [address], °[place] (336) 271-4999   °[facility]    (336) 373-0678   °[facility]    (336) 272-1050   °[facility] ° [address], [place] Phone:  (336) 832-4444, Fax:  (336) 832-4440 Hours of Operation:  9 am - 6 pm, M-F.  Also accepts Medicaid/Medicare and self-pay.  °[facility] ° [address], [place] Phone: (336) 832-3150, Fax: (336) [phone]. Hours of Operation:  8:30 am - 5:30 pm, M-F.  Also accepts Medicaid and self-pay.  °[facility] [place] [address], [place] Phone: (336) 878-6027   °[facility] [address], [place], [place] (336)723-1848, Ext. 123 Mondays & Thursdays: 7-9 AM.  First 15 patients are seen on a first come, first serve basis. °  ° °Medicaid-accepting [place] Providers: ° °Organization         Address  Phone   Notes  °[facility] [address], [place] (336) 641-2100 Also accepts self-pay patients.  °[facility] [address], [place] ° (336) 856-9996   °[facility] [address], [place]  (336) 288-8857   °[facility] [address] [place] [address], [place] (336) 299-7000   °Veita Bland [address], [place]  ° (336) 373-1557 Only accepts [place] Access Medicaid patients after they have their name applied to their card.  ° °Self-Pay (no insurance) in [place]: ° °Organization         Address  Phone   Notes  °Sickle Cell Patients, [facility] [address], [place] (336) 832-1970   °[facility] [address], [place] (336) 832-4400   °[facility] ° [address], [place] (336) 992-4800   °[facility]/Dr. Osei-Bonsu ° [address] [place] [address], [place] or [address], [place] (336) 841-8500 Phone number for both [place] and [place] locations is the same.  °[facility] [address], [place] (336) 299-0000   °[facility] [address] [place] [address], [place] or [address] (336) 852-7530 °(336) 878-2260   °[facility] [address], [place] (336) 350-1642, phone; (336) 294-5005, fax Sees patients 1st and 3rd Saturday of every month.  Must not qualify for public or private insurance (i.e. Medicaid, Medicare, [place] Health Choice, Veterans' Benefits) • Household income should be no more than 200% of the poverty level •The clinic cannot treat you if you are pregnant or think you are pregnant • Sexually transmitted diseases are not treated at the clinic.  ° ° °Dental Care: °Organization         Address  Phone  Notes  °[place] [facility] [address], [place] (336) 641-6152 Accepts children up to age 21 who are enrolled in Medicaid or [place] Health Choice; pregnant women with a Medicaid card; and children who have applied for Medicaid or [place] Health Choice, but were declined, whose parents can pay a reduced fee at time of service.  °[place]  [facility] [place]  [address], [place] (336) 641-7733 Accepts children up to age 21 who are enrolled in Medicaid or [place] Health Choice; pregnant women with a Medicaid card; and children who have applied for Medicaid or [place] Health Choice, but were declined, whose parents can pay a reduced fee at time of service.  °[facility] ° [address], [place] (336) 641-4533 Patients are seen by appointment only. Walk-ins are not accepted. [facility] will see patients 18 years of age and older. °Monday - Tuesday (8am-5pm) °Most Wednesdays (8:30-5pm) °$30 per visit, cash only  °[facility] ° [address], [place] (336) 641-4533 Patients are seen by appointment only. Walk-ins are not accepted. [facility] will see patients 18 years of age and older. °One   Wednesday Evening (Monthly: Volunteer Based).  $30 per visit, cash only  °[facility]  (919) 537-3737 for adults; Children under age 4, call Graduate Pediatric Dentistry at ([phone]) [phone]. Children aged 4-14, please call ([phone]) [phone] to request a pediatric application. ° Dental services are provided in all areas of dental care including fillings, crowns and bridges, complete and partial dentures, implants, gum treatment, root canals, and extractions. Preventive care is also provided. Treatment is provided to both adults and children. °Patients are selected via a lottery and there is often a waiting list. °  °[facility] [address], °[place] ° (336) 763-8833 [email or web address] °  °[facility] [address], [place], [place] (336)723-1848, Ext. 123 Second and Fourth Thursday of each month, opens at 6:30 AM; Clinic ends at 9 AM.  Patients are seen on a first-come first-served basis, and a limited number are seen during each clinic.  ° °[facility] ° [address], [place], [place] (336) 723-7904    Eligibility Requirements °You must have lived in [place], [place], or [place] counties for at least the last three months. °  You cannot be eligible for state or federal sponsored healthcare insurance, including Veterans Administration, Medicaid, or Medicare. °  You generally cannot be eligible for healthcare insurance through your employer.  °  How to apply: °Eligibility screenings are held every Tuesday and Wednesday afternoon from 1:00 pm until 4:00 pm. You do not need an appointment for the interview!  °[facility] [address], [place], [place] 336-631-2330   °[facility]  336-342-8273   °[facility]  336-703-3100   °[facility]  336-570-6415   ° °Behavioral Health Resources in the Community: °Intensive Outpatient Programs °Organization         Address  Phone  Notes  °[facility] [address], [place], [place] 336-878-6098   °[facility] [address], [place], [place] 336-832-9800   °[facility] [address], [place], [place] ° 336-882-2125   °[facility] [address],  °[place], [place] 1-800-853-5163 or 336-641-4981   °Substance Abuse Resources °Organization         Address  Phone  Notes  °[facility]  336-882-2125   °[facility]  336-784-9470   °[facility]  336-285-9073   °[facility]  336-845-3988   °Residential & Outpatient Substance Abuse Program  1-800-659-3381   °Psychological Services °Organization         Address  Phone  Notes  °[facility]  336- 832-9600   °[facility]  336- 378-7881   °[facility] [address], [place] 1-800-853-5163 or 336-641-4981   ° °Mobile Crisis Teams °Organization         Address  Phone  Notes  °[facility], Mobile Crisis Care Unit  1-877-626-1772   °Assertive °[facility] ° [address]  [place], [place] 336-834-9664   °Sharon DeEsch [address] °[place] 336-554-5454   ° °Self-Help/Support Groups °Organization         Address  Phone             Notes  °[facility] of  - variety of support groups  336- 373-1402 Call for more information  °Narcotics Anonymous (NA), [facility] [address], °[place]  2 meetings at this location  ° °  Residential Treatment Programs Organization         Address  Phone  Notes  [facility] [address],    [place]  [phone]) [phone]   [facility]  [address], [place] [phone], [place], [place] [phone]   [facility] [address], [place] [phone] Admissions: 8am-3pm M-F  [facility] [address]    [place], [place] [phone]) [phone]   [facility] [address], [place], [place] [phone]) [phone]   [facility] [address]  [place], [place] [phone]   [facility] Intensive Outpatient [address], [place] [phone]   [facility] ([facility] [address]  [place], [place] [phone] or [phone]   [facility] ([facility]) [address] [place], [place] [phone] Accepts Medicaid  [facility] [address]  [place] [phone] Substance Abuse/Addiction Treatment   [facility] Organization         Address  Phone  Notes  [facility]  (414)[phone](888) [phone]   Angie FavaJulie Brannon, PhD [address], [address] [place], [place]   [phone](336) [phone] or [phone](336) [phone]   [facility]   [address] [place], [place] [phone](336) [phone]   [facility] [address], [place], [place] (334) [phone](336) [phone] Insurance/Medicaid/sponsorship through [facility] [address]                                    [place], [place] [phone](336) [phone] Therapy/tele-psych/case    [facility] [address]   [place], [place] [phone](336) [phone]    Dr. Lolly MustacheArfeen  [phone](336) [phone]   [facility]  [employer] [facility] 1) [address], [place] 2) [address], [place] 3)  [address], [place] [phone](336) [phone]) [phone]  [phone](336) [phone]   [facility] Child Abuse Hotline [phone](336) [phone] or (941) [phone](336) [phone] (After Hours)      You have had a head injury which does not appear to require admission at this time. A concussion is a state of changed mental ability from trauma.  SEEK IMMEDIATE MEDICAL ATTENTION IF: There is confusion or drowsiness (although children frequently become drowsy after injury).  You cannot awaken the injured person.  There is nausea (feeling sick to your stomach) or continued, forceful vomiting.  You notice dizziness or unsteadiness which is getting worse, or inability to walk.  You have convulsions or unconsciousness.  You experience severe, persistent headaches not relieved by Tylenol. (Do not take aspirin as this impairs clotting abilities). Take other pain medications only as directed.  You cannot use arms or legs normally.  There are changes in pupil sizes. (This is the black center in the colored part of the eye)  There is clear or bloody discharge from the nose or ears.  Change in speech, vision, swallowing, or understanding.  Localized weakness, numbness, tingling, or change in bowel or bladder control.
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
## 2015-05-18 IMAGING — US US PELVIS COMPLETE
1 series · 13 of 25 positions shown · non-contrast
Comparison: The

CLINICAL DATA: Abdominal and pelvic pain, vaginal bleeding



[Series 1: us pelvis complete · 0.23mm/px · 13 of 82 slices shown]
[im 1/82]
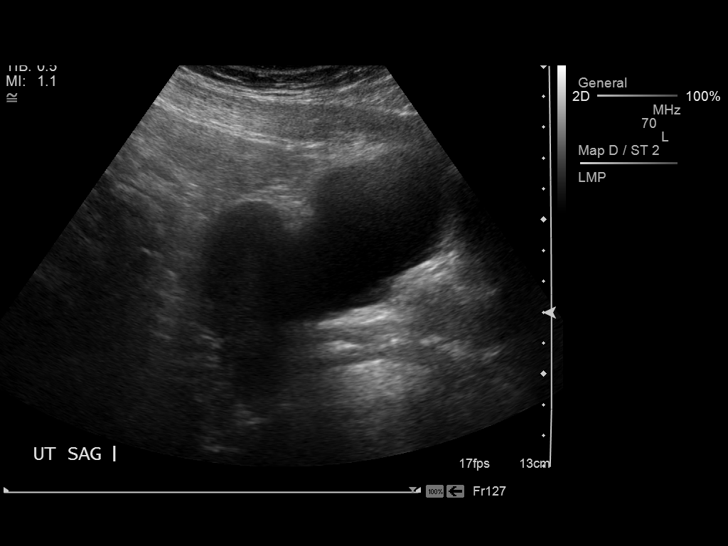
[im 7/82]
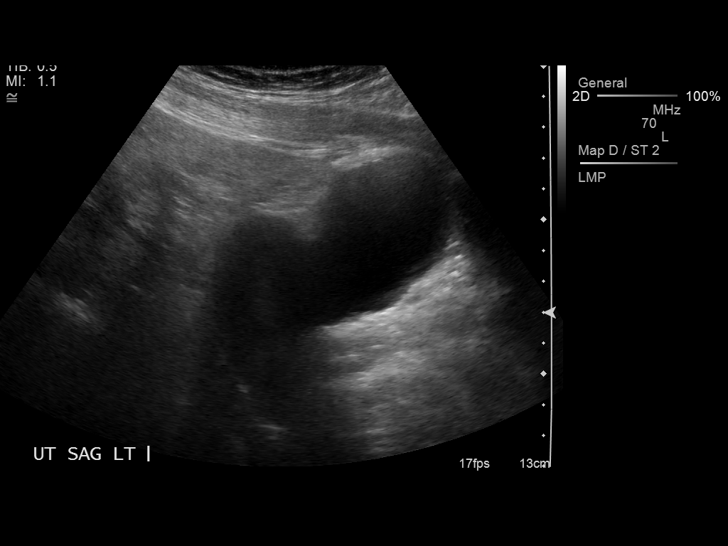
[im 14/82]
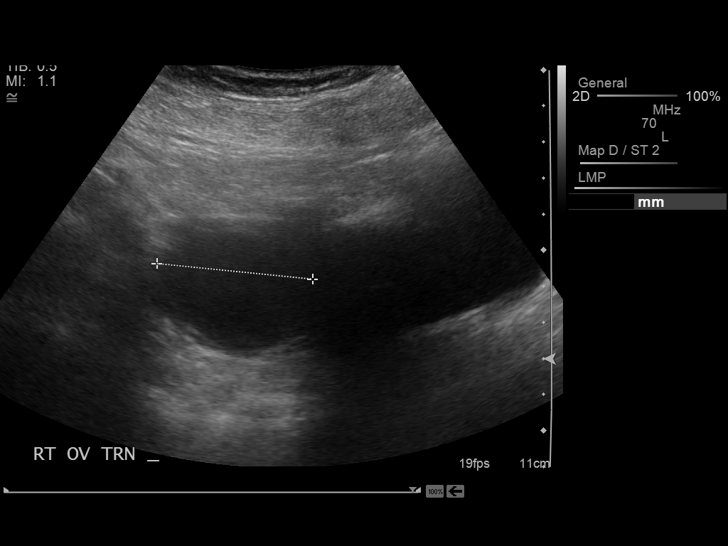
[im 21/82]
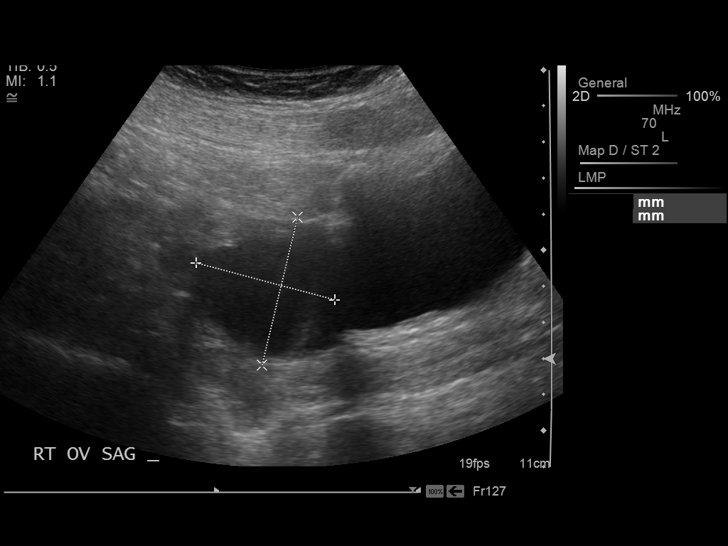
[im 28/82]
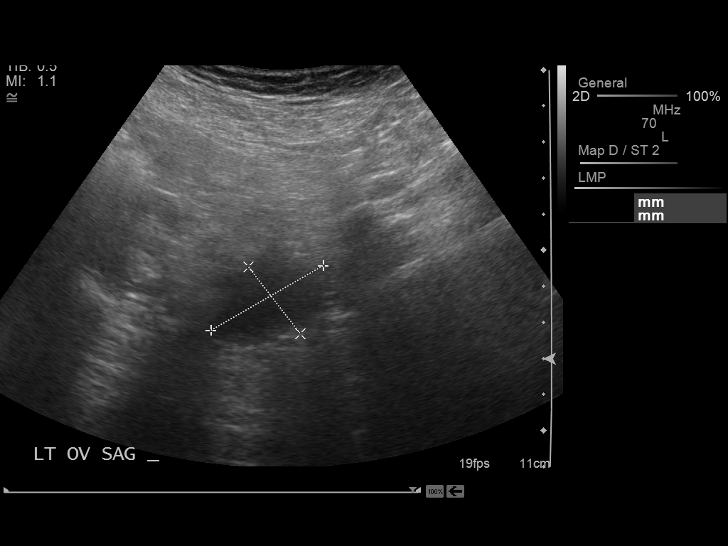
[im 34/82]
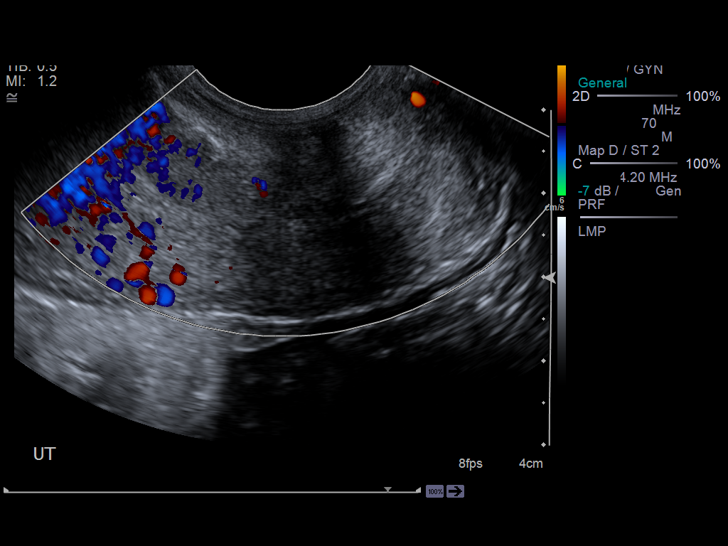
[im 41/82]
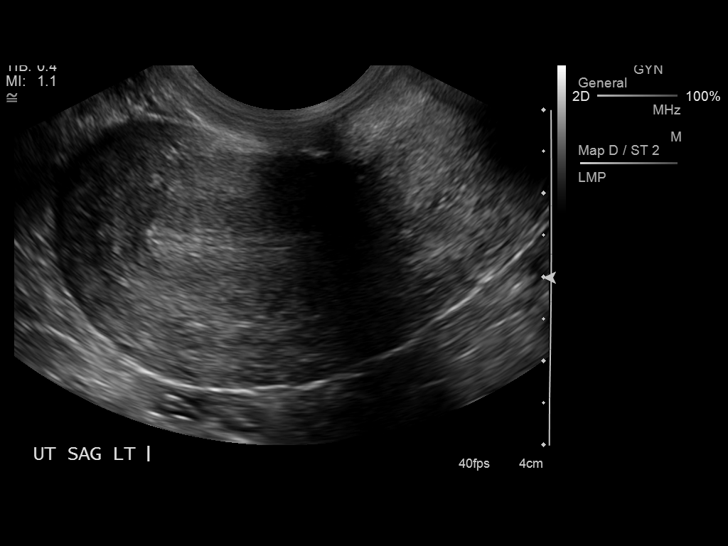
[im 48/82]
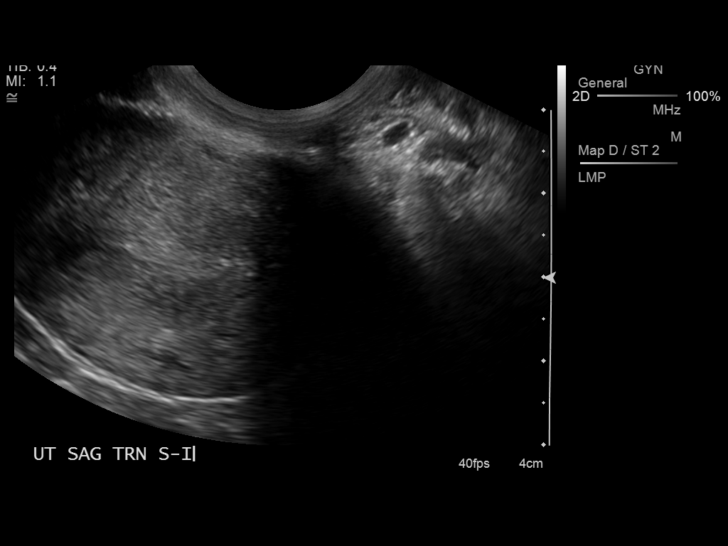
[im 55/82]
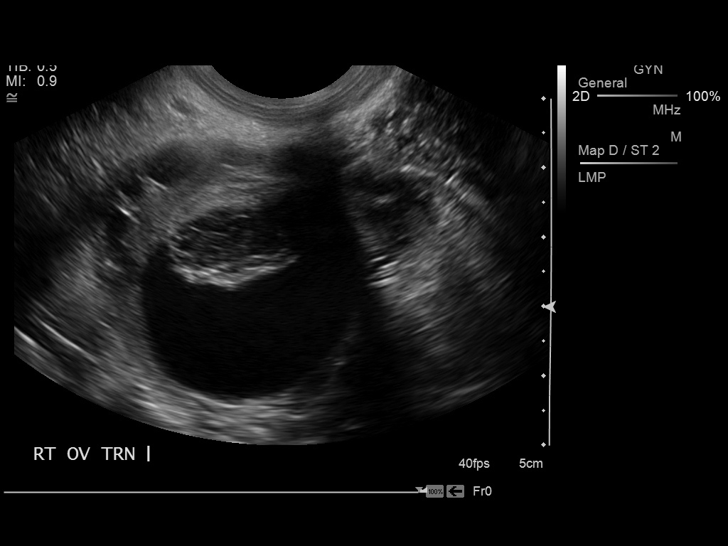
[im 61/82]
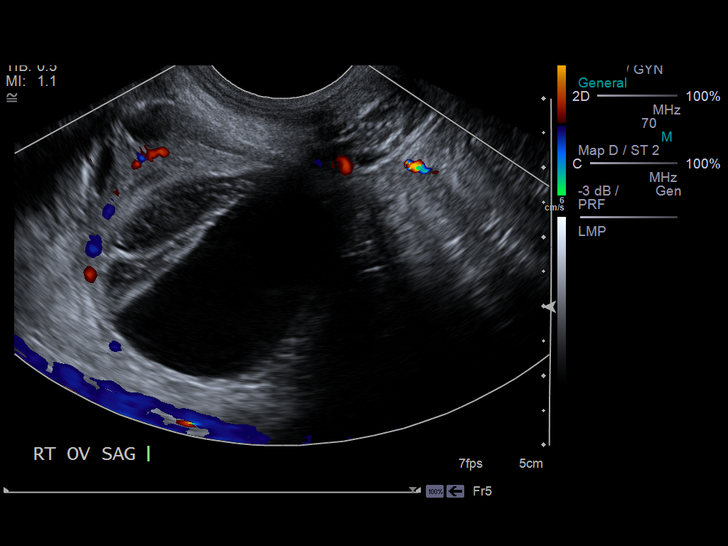
[im 68/82]
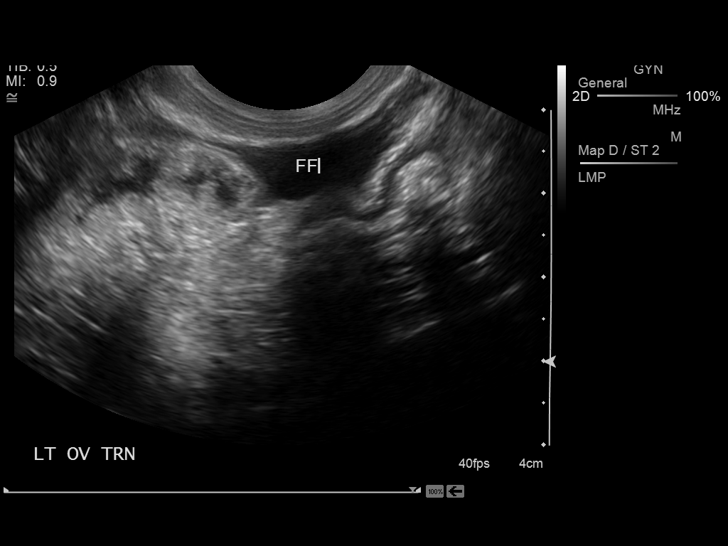
[im 75/82]
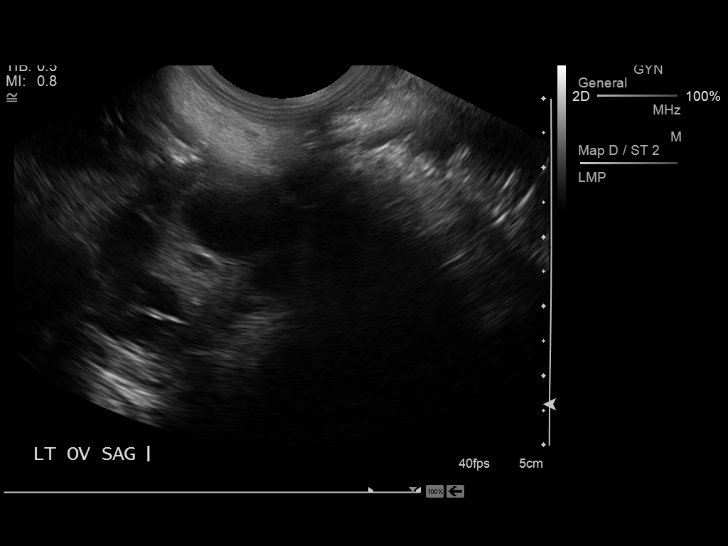
[im 82/82]
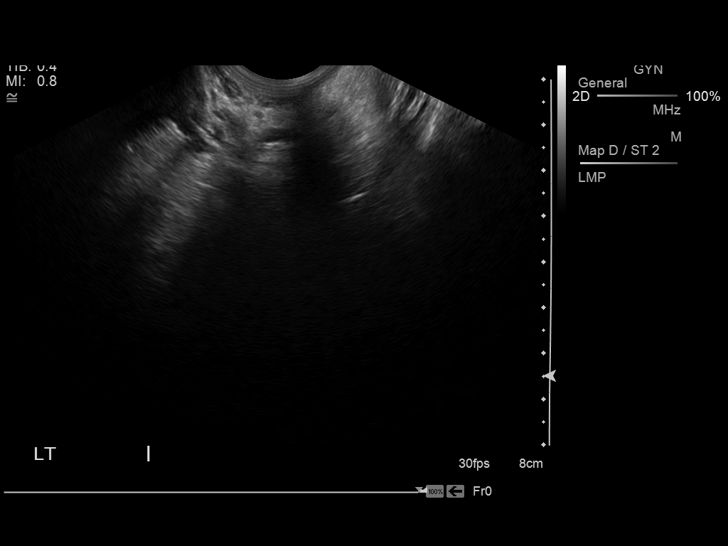

[13 of 25 positions shown; findings below may reference images not displayed]

FINDINGS: Uterus: 7.1 x 2.8 x 5.0 cm.  Normal morphology without mass.

Endometrium: 5 mm thick, normal.  No endometrial fluid.

Right ovary:  4.2 x 3.8 x 4.1 cm.  Complex cystic lesion within the
right ovary 2.9 x 3.1 x 3.3 cm, containing an eccentric area of
heterogeneous isotope hypoechoic echogenicity.  This could
represent a hemorrhagic cyst with layered debris/blood products.
No blood flow is identified within the eccentric area of
echogenicity on color Doppler imaging. Blood flow present within
the remaining normal right ovary on color Doppler imaging.

Left ovary: 3.2 x 2.8 x 2.4 cm.  Normal morphology without mass.

Other findings: No free fluid or adnexal masses otherwise seen.
IMPRESSION: Unremarkable uterus and left ovary.
Complicated cystic lesion 2.9 x 3.1 x 3.3 cm within the right ovary
containing eccentric internal material which lacks internal blood
flow on color Doppler imaging.
This could represent a complicated hemorrhagic right ovarian cyst
though other etiologies are not excluded. Follow-up imaging is
recommended in 6-12 weeks.

## 2016-01-14 ENCOUNTER — Encounter (HOSPITAL_COMMUNITY): Payer: Self-pay | Admitting: *Deleted

## 2016-01-14 ENCOUNTER — Emergency Department (HOSPITAL_COMMUNITY): Payer: Self-pay

## 2016-01-14 ENCOUNTER — Emergency Department (HOSPITAL_COMMUNITY)
Admission: EM | Admit: 2016-01-14 | Discharge: 2016-01-14 | Disposition: A | Discharge status: Home / Self Care | Payer: Self-pay | Attending: Emergency Medicine | Admitting: Emergency Medicine

## 2016-01-14 DIAGNOSIS — Z79899 Other long term (current) drug therapy: Secondary | ICD-10-CM | POA: Insufficient documentation

## 2016-01-14 DIAGNOSIS — J45909 Unspecified asthma, uncomplicated: Secondary | ICD-10-CM | POA: Insufficient documentation

## 2016-01-14 DIAGNOSIS — H6091 Unspecified otitis externa, right ear: Secondary | ICD-10-CM

## 2016-01-14 DIAGNOSIS — H60531 Acute contact otitis externa, right ear: Secondary | ICD-10-CM | POA: Insufficient documentation

## 2016-01-14 DIAGNOSIS — F17210 Nicotine dependence, cigarettes, uncomplicated: Secondary | ICD-10-CM | POA: Insufficient documentation

## 2016-01-14 DIAGNOSIS — H6691 Otitis media, unspecified, right ear: Secondary | ICD-10-CM | POA: Insufficient documentation

## 2016-01-14 LAB — BASIC METABOLIC PANEL
Anion gap: 8 (ref 5–15)
BUN: 6 mg/dL (ref 6–20)
CHLORIDE: 104 mmol/L (ref 101–111)
CO2: 23 mmol/L (ref 22–32)
Calcium: 8.8 mg/dL — ABNORMAL LOW (ref 8.9–10.3)
Creatinine, Ser: 0.6 mg/dL (ref 0.44–1.00)
GFR calc non Af Amer: >60 mL/min (ref >60)
Glucose, Bld: 133 mg/dL — ABNORMAL HIGH (ref 65–99)
Potassium: 3.8 mmol/L (ref 3.5–5.1)
SODIUM: 135 mmol/L (ref 135–145)

## 2016-01-14 LAB — CBC WITH DIFFERENTIAL/PLATELET
BASOS PCT: 0 %
Basophils Absolute: 0 10*3/uL (ref 0.0–0.1)
Eosinophils Absolute: 0.1 10*3/uL (ref 0.0–0.7)
Eosinophils Relative: 1 %
HEMATOCRIT: 43 % (ref 36.0–46.0)
HEMOGLOBIN: 14.7 g/dL (ref 12.0–15.0)
LYMPHS PCT: 14 %
Lymphs Abs: 1.5 10*3/uL (ref 0.7–4.0)
MCH: 30.8 pg (ref 26.0–34.0)
MCHC: 34.2 g/dL (ref 30.0–36.0)
MCV: 90 fL (ref 78.0–100.0)
MONOS PCT: 6 %
Monocytes Absolute: 0.7 10*3/uL (ref 0.1–1.0)
Neutro Abs: 8.7 10*3/uL — ABNORMAL HIGH (ref 1.7–7.7)
Neutrophils Relative %: 79 %
Platelets: 219 10*3/uL (ref 150–400)
RBC: 4.78 MIL/uL (ref 3.87–5.11)
RDW: 14.5 % (ref 11.5–15.5)
WBC: 10.9 10*3/uL — ABNORMAL HIGH (ref 4.0–10.5)

## 2016-01-14 LAB — I-STAT CG4 LACTIC ACID, ED: LACTIC ACID, VENOUS: 0.71 mmol/L (ref 0.5–2.0)

## 2016-01-14 MED ORDER — AMOXICILLIN-POT CLAVULANATE 875-125 MG PO TABS: 1.0000 | ORAL_TABLET | Freq: Two times a day (BID) | ORAL | Status: DC

## 2016-01-14 MED ORDER — CLINDAMYCIN PHOSPHATE 300 MG/50ML IV SOLN
300.0000 mg | Freq: Once | INTRAVENOUS | Status: AC
  Administered 2016-01-14: 300 mg via INTRAVENOUS
  Filled 2016-01-14: qty 50

## 2016-01-14 MED ORDER — NEOMYCIN-POLYMYXIN-HC 3.5-10000-1 OT SUSP: 2.0000 [drp] | Freq: Three times a day (TID) | OTIC | Status: DC

## 2016-01-14 MED ORDER — FENTANYL CITRATE (PF) 100 MCG/2ML IJ SOLN
100.0000 ug | Freq: Once | INTRAMUSCULAR | Status: AC
  Administered 2016-01-14: 100 ug via INTRAVENOUS
  Filled 2016-01-14: qty 2

## 2016-01-14 MED ORDER — OXYCODONE-ACETAMINOPHEN 5-325 MG PO TABS
1.0000 | ORAL_TABLET | ORAL | Status: DC | PRN
Start: 2016-01-14 — End: 2017-06-22

## 2016-01-14 MED ORDER — ONDANSETRON HCL 4 MG/2ML IJ SOLN
4.0000 mg | Freq: Once | INTRAMUSCULAR | Status: AC
  Administered 2016-01-14: 4 mg via INTRAVENOUS
  Filled 2016-01-14: qty 2

## 2016-01-14 MED ORDER — SODIUM CHLORIDE 0.9 % IV BOLUS (SEPSIS)
1000.0000 mL | Freq: Once | INTRAVENOUS | Status: AC
  Administered 2016-01-14: 1000 mL via INTRAVENOUS
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
## 2016-01-14 NOTE — Discharge Instructions (Signed)
CT scan shows no abscess in your ear or face. Prescription for antibiotic, eardrops, pain medicine. Follow-up with ear nose and throat doctor if not improving. Phone number given.
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
## 2016-01-14 NOTE — ED Notes (Signed)
Pt with right ear pain and drainage since Wednesday, denies fever.

## 2016-01-14 NOTE — ED Notes (Signed)
EDP at bedside updating patient and family. 

## 2016-01-14 NOTE — ED Notes (Signed)
Pt requesting more medication for pain. Dr. Cook notified.  

## 2017-06-22 ENCOUNTER — Encounter (HOSPITAL_COMMUNITY): Payer: Self-pay | Admitting: Emergency Medicine

## 2017-06-22 ENCOUNTER — Emergency Department (HOSPITAL_COMMUNITY)
Admission: EM | Admit: 2017-06-22 | Discharge: 2017-06-22 | Disposition: A | Discharge status: Home / Self Care | Payer: Self-pay | Attending: Emergency Medicine | Admitting: Emergency Medicine

## 2017-06-22 DIAGNOSIS — J40 Bronchitis, not specified as acute or chronic: Secondary | ICD-10-CM | POA: Insufficient documentation

## 2017-06-22 DIAGNOSIS — F17210 Nicotine dependence, cigarettes, uncomplicated: Secondary | ICD-10-CM | POA: Insufficient documentation

## 2017-06-22 DIAGNOSIS — Z79899 Other long term (current) drug therapy: Secondary | ICD-10-CM | POA: Insufficient documentation

## 2017-06-22 MED ORDER — ALBUTEROL SULFATE HFA 108 (90 BASE) MCG/ACT IN AERS: 2.0000 | INHALATION_SPRAY | RESPIRATORY_TRACT | 3 refills | Status: DC | PRN

## 2017-06-22 MED ORDER — AZITHROMYCIN 250 MG PO TABS: 250.0000 mg | ORAL_TABLET | Freq: Every day | ORAL | 0 refills | Status: DC

## 2017-06-22 NOTE — ED Triage Notes (Signed)
Patient c/o fever. Per patient seen at doctors office Tuesday for nausea, fever, and vomiting. Patient told she had a virus. Patient still reports fever and now has a headache. Patient denies any nausea or vomiting since Thursday. Patient reports taking tylenol for pain and fevers-last dose 2 hours prior to arriving to ED. Patient states doctor is not in office and she needs a doctors note for Wednesday and Thursday to keep her job.

## 2017-06-22 NOTE — Discharge Instructions (Signed)

## 2017-06-22 NOTE — ED Provider Notes (Signed)
[facility]-EM[facility]Y DEPT Provider Note   CSN: [id number] Arrival date & time: 06/22/17  1355     History   Chief Complaint Chief Complaint  Patient presents with  . Fever    HPI Rhonda Good is a 26 y.o. female.  HPI  The patient is a 26 year old female, she has a known history of vomiting and diarrhea which started approximately 4 days ago, she also has a history of smoking cigarettes and over the last couple of days has developed worsening coughing and wheezing. She states that she has had low-grade temperatures and has had a maximum temperature of 101 over the last several days. The vomiting and diarrhea stopped and now she is left with just ongoing productive cough and wheezing. Nothing seems to make this better or worse, no medications given pretty hospital. 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 Abnormal Pap smear of cervix   . Asthma   . Chronic back pain   . Ovarian cyst     Patient Active Problem List   Diagnosis Date Noted  . Abnormal Pap smear of cervix 12/18/2013  . Alcohol dependence (HCC) 08/23/2012  . Substance induced mood disorder (HCC) 08/23/2012  . Encephalopathy acute 08/20/2012  . Overdose 08/20/2012  . Hypotension 08/20/2012  . Alcohol intoxication (HCC) 08/20/2012  . Bipolar disorder (HCC) 08/20/2012    Past Surgical History:  Procedure Laterality Date  . HAND SURGERY    . MOUTH SURGERY    . ORIF TIBIA PLATEAU Left 07/25/2013   Procedure: OPEN REDUCTION INTERNAL FIXATION (ORIF) TIBIAL PLATEAU;  Surgeon: Toni Arthurs, MD;  Location: [facility] OR;  Service: Orthopedics;  Laterality: Left;    OB History    Gravida Para Term Preterm AB Living   1       1     SAB TAB Ectopic Multiple Live Births                   Home Medications    Prior to Admission medications   Medication Sig Start Date End Date Taking? Authorizing Provider  Oxycodone HCl 10 MG  TABS Take 10 mg by mouth 4 (four) times daily.   Yes [provider]  traZODone (DESYREL) 100 MG tablet Take 100 mg by mouth at bedtime as needed for sleep.    Yes [provider]  albuterol (PROVENTIL HFA;VENTOLIN HFA) 108 (90 Base) MCG/ACT inhaler Inhale 2 puffs into the lungs every 4 (four) hours as needed for wheezing or shortness of breath. 06/22/17   Eber Hong, MD  azithromycin (ZITHROMAX Z-PAK) 250 MG tablet Take 1 tablet (250 mg total) by mouth daily.  PO day 1, then  PO days 205 06/22/17   Eber Hong, MD    Family History Family History  Problem Relation Age of Onset  . Diabetes Mother   . Hyperlipidemia Mother   . Cancer Maternal Grandfather   . Diabetes Paternal Grandmother   . Diabetes Maternal Aunt   . Diabetes Maternal Uncle     Social History Social History  Substance Use Topics  . Smoking status: Current Every Day Smoker    Packs/day: 1.00    Years: 10.00    Types: Cigarettes  . Smokeless tobacco: Never Used  . Alcohol use No     Allergies   Hydrocodone and Naproxen   Review of Systems Review of Systems  Constitutional: Positive for fever.  Respiratory: Positive for cough and shortness of breath.   Gastrointestinal: Positive for diarrhea and vomiting.     Physical Exam Updated Vital Signs BP (!) 118/93 (BP Location: Right Arm)   Pulse 62   Temp 99.1 F (37.3 C) (Oral)   Resp 18   Ht  (1.753 m)   Wt 77.1 kg (170 lb)   LMP 06/08/2017   SpO2 100%   BMI 25.10 kg/m   Physical Exam  Constitutional: She appears well-developed and well-nourished. No distress.  HENT:  Head: Normocephalic and atraumatic.  Mouth/Throat: Oropharynx is clear and moist. No oropharyngeal exudate.  Eyes: Pupils are equal, round, and reactive to light. Conjunctivae and EOM are normal. Right eye exhibits no discharge. Left eye exhibits no discharge. No scleral icterus.  Neck: Normal range of motion. Neck supple. No JVD present. No  thyromegaly present.  Cardiovascular: Normal rate, regular rhythm, normal heart sounds and intact distal pulses.  Exam reveals no gallop and no friction rub.   No murmur heard. Pulmonary/Chest: Effort normal. No respiratory distress. She has wheezes. She has no rales.  Abdominal: Soft. Bowel sounds are normal. She exhibits no distension and no mass. There is no tenderness.  Musculoskeletal: Normal range of motion. She exhibits no edema or tenderness.  Lymphadenopathy:    She has no cervical adenopathy.  Neurological: She is alert. Coordination normal.  Skin: Skin is warm and dry. No rash noted. No erythema.  Psychiatric: She has a normal mood and affect. Her behavior is normal.  Nursing note and vitals reviewed.    ED Treatments / Results  Labs (all labs ordered are listed, but only abnormal results are displayed) Labs Reviewed - No data to display   Radiology No results found.  Procedures Procedures (including critical care time)  Medications Ordered in ED Medications - No data to display   Initial Impression / Assessment and Plan / ED Course  I have reviewed the triage vital signs and the nursing notes.  Pertinent labs & imaging results that were available during my care of the patient were reviewed by me and considered in my medical decision making (see chart for details).     The patient's symptoms are consistent with bronchitis with coughing, wheezing but no upper respiratory symptoms. She we'll get albuterol, zithromax Pt stable for d/c.  Vitals:   06/22/17 1410  BP: (!) 118/93  Pulse: 62  Resp: 18  Temp: 99.1 F (37.3 C)  TempSrc: Oral  SpO2: 100%  Weight: 77.1 kg (170 lb)  Height:  (1.753 m)     Final Clinical Impressions(s) / ED Diagnoses   Final diagnoses:  Bronchitis    New Prescriptions New Prescriptions   ALBUTEROL (PROVENTIL HFA;VENTOLIN HFA) 108 (90 BASE) MCG/ACT INHALER    Inhale 2 puffs into the lungs every 4 (four) hours as needed  for wheezing or shortness of breath.   AZITHROMYCIN (ZITHROMAX Z-PAK) 250 MG TABLET    Take 1 tablet (250 mg total) by mouth daily.  PO day 1, then  PO days 205     Eber Hong, MD 06/22/17 1437

## 2018-03-13 ENCOUNTER — Encounter (HOSPITAL_COMMUNITY): Payer: Self-pay

## 2018-03-13 ENCOUNTER — Other Ambulatory Visit: Payer: Self-pay

## 2018-03-13 ENCOUNTER — Emergency Department (HOSPITAL_COMMUNITY)
Admission: EM | Admit: 2018-03-13 | Discharge: 2018-03-13 | Disposition: A | Discharge status: Home / Self Care | Payer: Self-pay | Attending: Emergency Medicine | Admitting: Emergency Medicine

## 2018-03-13 DIAGNOSIS — J45909 Unspecified asthma, uncomplicated: Secondary | ICD-10-CM | POA: Insufficient documentation

## 2018-03-13 DIAGNOSIS — T401X1A Poisoning by heroin, accidental (unintentional), initial encounter: Secondary | ICD-10-CM | POA: Insufficient documentation

## 2018-03-13 DIAGNOSIS — Z79899 Other long term (current) drug therapy: Secondary | ICD-10-CM | POA: Insufficient documentation

## 2018-03-13 DIAGNOSIS — F17210 Nicotine dependence, cigarettes, uncomplicated: Secondary | ICD-10-CM | POA: Insufficient documentation

## 2018-03-13 HISTORY — DX: Other psychoactive substance abuse, uncomplicated: F19.10

## 2018-03-13 HISTORY — DX: Bipolar disorder, unspecified: F31.9

## 2018-03-13 HISTORY — DX: Poisoning by unspecified drugs, medicaments and biological substances, accidental (unintentional), initial encounter: T50.901A

## 2018-03-13 LAB — COMPREHENSIVE METABOLIC PANEL
ALT: 23 U/L (ref 14–54)
AST: 25 U/L (ref 15–41)
Albumin: 3.9 g/dL (ref 3.5–5.0)
Alkaline Phosphatase: 72 U/L (ref 38–126)
Anion gap: 9 (ref 5–15)
BUN: 11 mg/dL (ref 6–20)
CO2: 25 mmol/L (ref 22–32)
CREATININE: 0.59 mg/dL (ref 0.44–1.00)
Calcium: 8.6 mg/dL — ABNORMAL LOW (ref 8.9–10.3)
Chloride: 105 mmol/L (ref 101–111)
GFR calc Af Amer: >60 mL/min (ref >60)
Glucose, Bld: 100 mg/dL — ABNORMAL HIGH (ref 65–99)
Potassium: 4.2 mmol/L (ref 3.5–5.1)
Sodium: 139 mmol/L (ref 135–145)
Total Bilirubin: 1 mg/dL (ref 0.3–1.2)
Total Protein: 7.2 g/dL (ref 6.5–8.1)

## 2018-03-13 LAB — CBC WITH DIFFERENTIAL/PLATELET
BASOS ABS: 0 10*3/uL (ref 0.0–0.1)
BASOS PCT: 0 %
Eosinophils Absolute: 0 10*3/uL (ref 0.0–0.7)
Eosinophils Relative: 0 %
HCT: 41.9 % (ref 36.0–46.0)
HEMOGLOBIN: 13.5 g/dL (ref 12.0–15.0)
Lymphocytes Relative: 16 %
Lymphs Abs: 1.6 10*3/uL (ref 0.7–4.0)
MCH: 30.8 pg (ref 26.0–34.0)
MCHC: 32.2 g/dL (ref 30.0–36.0)
MCV: 95.4 fL (ref 78.0–100.0)
Monocytes Absolute: 0.6 10*3/uL (ref 0.1–1.0)
Monocytes Relative: 6 %
NEUTROS ABS: 7.6 10*3/uL (ref 1.7–7.7)
NEUTROS PCT: 78 %
Platelets: 237 10*3/uL (ref 150–400)
RBC: 4.39 MIL/uL (ref 3.87–5.11)
RDW: 13.9 % (ref 11.5–15.5)
WBC: 9.8 10*3/uL (ref 4.0–10.5)

## 2018-03-13 LAB — ETHANOL

## 2018-03-13 LAB — SALICYLATE LEVEL

## 2018-03-13 LAB — ACETAMINOPHEN LEVEL

## 2018-03-13 NOTE — Discharge Instructions (Addendum)
Stop using drugs.

## 2018-03-13 NOTE — ED Provider Notes (Signed)
[facility] EMERGENCY DEPARTMENT Provider Note   CSN: [id number] Arrival date & time: 03/13/18  [id number]    Pt seen with NP student, I performed history/physical/documentation  History   Chief Complaint Chief Complaint  Patient presents with  . Drug Overdose   Level 5 caveat due to altered mental status HPI Rhonda Good is a 27 y.o. female.  The history is provided by the patient and the EMS personnel.  Drug Overdose  This is a new problem. Episode onset: Prior to arrival. The problem occurs constantly. The problem has been gradually improving. Nothing aggravates the symptoms. Relieved by: narcan.  Patient with known history of IV drug abuse presents after overdose.  She reportedly injected IV heroin, soon after became somnolent.  She received Narcan prior to arrival.  Patient received Narcan from first responders as well as family.  Patient is more arousable, but is difficult to awake enough to ask any questions.  No SI has been reported  Past Medical History:  Diagnosis Date  . Abnormal Pap smear of cervix   . Asthma   . Bipolar 1 disorder (HCC)   . Chronic back pain   . Drug abuse, IV (HCC)   . Drug overdose   . Ovarian cyst     Patient Active Problem List   Diagnosis Date Noted  . Abnormal Pap smear of cervix 12/18/2013  . Alcohol dependence (HCC) 08/23/2012  . Substance induced mood disorder (HCC) 08/23/2012  . Encephalopathy acute 08/20/2012  . Overdose 08/20/2012  . Hypotension 08/20/2012  . Alcohol intoxication (HCC) 08/20/2012  . Bipolar disorder (HCC) 08/20/2012    Past Surgical History:  Procedure Laterality Date  . HAND SURGERY    . MOUTH SURGERY    . ORIF TIBIA PLATEAU Left 07/25/2013   Procedure: OPEN REDUCTION INTERNAL FIXATION (ORIF) TIBIAL PLATEAU;  Surgeon: Toni ArthursJohn Hewitt, MD;  Location: [facility] OR;  Service: Orthopedics;  Laterality: Left;     OB History    Gravida  1   Para      Term      Preterm      AB  1   Living        SAB      TAB        Ectopic      Multiple      Live Births               Home Medications    Prior to Admission medications   Medication Sig Start Date End Date Taking? Authorizing Provider  QUEtiapine (SEROQUEL) 100 MG tablet Take 100 mg by mouth at bedtime.   Yes [provider]  albuterol (PROVENTIL HFA;VENTOLIN HFA) 108 (90 Base) MCG/ACT inhaler Inhale 2 puffs into the lungs every 4 (four) hours as needed for wheezing or shortness of breath. 06/22/17   Eber HongMiller, Brian, MD  azithromycin (ZITHROMAX Z-PAK) 250 MG tablet Take 1 tablet (250 mg total) by mouth daily. 500mg  PO day 1, then 250mg  PO days 205 06/22/17   Eber HongMiller, Brian, MD  Oxycodone HCl 10 MG TABS Take 10 mg by mouth 4 (four) times daily.    [provider]  traZODone (DESYREL) 100 MG tablet Take 100 mg by mouth at bedtime as needed for sleep.     [provider]    Family History Family History  Problem Relation Age of Onset  . Diabetes Mother   . Hyperlipidemia Mother   . Cancer Maternal Grandfather   . Diabetes Paternal Grandmother   .  Diabetes Maternal Aunt   . Diabetes Maternal Uncle     Social History Social History   Tobacco Use  . Smoking status: Current Every Day Smoker    Packs/day: 1.00    Years: 10.00    Pack years: 10.00    Types: Cigarettes  . Smokeless tobacco: Never Used  Substance Use Topics  . Alcohol use: Yes    Comment: occ  . Drug use: Yes    Types: IV    Comment: heroin     Allergies   Hydrocodone and Naproxen   Review of Systems Review of Systems  Unable to perform ROS: Mental status change  Psychiatric/Behavioral: Negative for suicidal ideas.     Physical Exam Updated Vital Signs BP 109/76   Pulse 84   Temp 98.1 F (36.7 C) (Oral)   Resp (!) 30   Ht 1.753 m (5\' 9" )   Wt 88.5 kg (195 lb)   LMP 02/10/2018 (Approximate)   SpO2 97%   BMI 28.80 kg/m   Physical Exam  CONSTITUTIONAL: Well-developed, somnolent HEAD: Normocephalic/atraumatic EYES:  EOMI/PERRL, pupils not pinpoint ENMT: Mucous membranes moist NECK: supple no meningeal signs CV: S1/S2 noted, no murmurs/rubs/gallops noted LUNGS: Lungs are clear to auscultation bilaterally, no apparent distress ABDOMEN: soft NEURO: Pt is somnolent, but arousable answer some questions and goes back to sleep.  Moves all extremities x4 EXTREMITIES: pulses normal/equal, full ROM SKIN: healing lesions to both arms PSYCH: unable To assess  ED Treatments / Results  Labs (all labs ordered are listed, but only abnormal results are displayed) Labs Reviewed  COMPREHENSIVE METABOLIC PANEL  CBC WITH DIFFERENTIAL/PLATELET  ETHANOL  RAPID URINE DRUG SCREEN, HOSP PERFORMED  ACETAMINOPHEN LEVEL  SALICYLATE LEVEL  I-STAT BETA HCG BLOOD, ED ([facility], [facility], [facility] ONLY)    EKG EKG Interpretation  Date/Time:  Thursday March 13 2018 04:05:14 EDT Ventricular Rate:  96 PR Interval:    QRS Duration: 81 QT Interval:  350 QTC Calculation: 443 R Axis:   46 Text Interpretation:  Sinus rhythm RSR' in V1 or V2, right VCD or RVH Interpretation limited secondary to artifact Otherwise no significant change Confirmed by Zadie Rhine ([id number]) on 03/13/2018 4:18:31 AM   Radiology No results found.  Procedures Procedures    Medications Ordered in ED Medications - No data to display   Initial Impression / Assessment and Plan / ED Course  I have reviewed the triage vital signs and the nursing notes.     7:30 AM Patient seen after heroin overdose.  Her mental status has remained unchanged, she is somnolent but will easily arouse to voice.  Vitals have been running appropriate, no significant hypoxia.  However she is not back to baseline. Plan at signout to Dr. Adriana Simas is to monitor patient over next 2 hours, reassess mental status and check labs  Final Clinical Impressions(s) / ED Diagnoses   Final diagnoses:  Accidental overdose of heroin, initial encounter [facility])    ED Discharge Orders    None         Zadie Rhine, MD 03/13/18 [phone]

## 2018-03-13 NOTE — ED Provider Notes (Signed)
Patient is ambulatory without neurological deficits.  No chest pain or dyspnea.   Donnetta Hutchingook, Jawanda Passey, MD 03/13/18 [phone]

## 2018-03-13 NOTE — ED Triage Notes (Signed)
Pt reports "getting high on heroin" tonight intravenously. Pt denies trying to harm self.

## 2018-04-04 ENCOUNTER — Other Ambulatory Visit: Payer: Self-pay

## 2018-04-04 ENCOUNTER — Emergency Department (HOSPITAL_COMMUNITY): Payer: Self-pay

## 2018-04-04 ENCOUNTER — Encounter (HOSPITAL_COMMUNITY): Payer: Self-pay | Admitting: *Deleted

## 2018-04-04 ENCOUNTER — Emergency Department (HOSPITAL_COMMUNITY)
Admission: EM | Admit: 2018-04-04 | Discharge: 2018-04-05 | Disposition: A | Discharge status: Home / Self Care | Payer: Self-pay | Attending: Emergency Medicine | Admitting: Emergency Medicine

## 2018-04-04 DIAGNOSIS — R1011 Right upper quadrant pain: Secondary | ICD-10-CM | POA: Insufficient documentation

## 2018-04-04 DIAGNOSIS — K55069 Acute infarction of intestine, part and extent unspecified: Secondary | ICD-10-CM | POA: Insufficient documentation

## 2018-04-04 DIAGNOSIS — J45909 Unspecified asthma, uncomplicated: Secondary | ICD-10-CM | POA: Insufficient documentation

## 2018-04-04 DIAGNOSIS — F17210 Nicotine dependence, cigarettes, uncomplicated: Secondary | ICD-10-CM | POA: Insufficient documentation

## 2018-04-04 LAB — CBC WITH DIFFERENTIAL/PLATELET
Abs Immature Granulocytes: 0 10*3/uL (ref 0.0–0.1)
Basophils Absolute: 0 10*3/uL (ref 0.0–0.1)
Basophils Relative: 1 %
EOS ABS: 0.1 10*3/uL (ref 0.0–0.7)
Eosinophils Relative: 2 %
HEMATOCRIT: 42.1 % (ref 36.0–46.0)
HEMOGLOBIN: 13.4 g/dL (ref 12.0–15.0)
IMMATURE GRANULOCYTES: 1 %
LYMPHS ABS: 2.7 10*3/uL (ref 0.7–4.0)
LYMPHS PCT: 35 %
MCH: 29.9 pg (ref 26.0–34.0)
MCHC: 31.8 g/dL (ref 30.0–36.0)
MCV: 94 fL (ref 78.0–100.0)
Monocytes Absolute: 0.3 10*3/uL (ref 0.1–1.0)
Monocytes Relative: 4 %
Neutro Abs: 4.7 10*3/uL (ref 1.7–7.7)
Neutrophils Relative %: 59 %
Platelets: 342 10*3/uL (ref 150–400)
RBC: 4.48 MIL/uL (ref 3.87–5.11)
RDW: 14.1 % (ref 11.5–15.5)
WBC: 7.9 10*3/uL (ref 4.0–10.5)

## 2018-04-04 LAB — URINALYSIS, ROUTINE W REFLEX MICROSCOPIC
BILIRUBIN URINE: NEGATIVE
Glucose, UA: NEGATIVE mg/dL
Hgb urine dipstick: NEGATIVE
Ketones, ur: NEGATIVE mg/dL
Leukocytes, UA: NEGATIVE
NITRITE: NEGATIVE
PH: 7 (ref 5.0–8.0)
Protein, ur: NEGATIVE mg/dL
SPECIFIC GRAVITY, URINE: 1.002 — AB (ref 1.005–1.030)

## 2018-04-04 LAB — LIPASE, BLOOD: Lipase: 35 U/L (ref 11–51)

## 2018-04-04 LAB — COMPREHENSIVE METABOLIC PANEL
ALT: 46 U/L — AB (ref 0–44)
AST: 68 U/L — ABNORMAL HIGH (ref 15–41)
Albumin: 3.4 g/dL — ABNORMAL LOW (ref 3.5–5.0)
Alkaline Phosphatase: 92 U/L (ref 38–126)
Anion gap: 8 (ref 5–15)
CHLORIDE: 105 mmol/L (ref 98–111)
CO2: 26 mmol/L (ref 22–32)
CREATININE: 0.76 mg/dL (ref 0.44–1.00)
Calcium: 9.1 mg/dL (ref 8.9–10.3)
GFR calc Af Amer: >60 mL/min (ref >60)
GFR calc non Af Amer: >60 mL/min (ref >60)
Glucose, Bld: 149 mg/dL — ABNORMAL HIGH (ref 70–99)
POTASSIUM: 3.7 mmol/L (ref 3.5–5.1)
SODIUM: 139 mmol/L (ref 135–145)
Total Bilirubin: 0.5 mg/dL (ref 0.3–1.2)
Total Protein: 6.8 g/dL (ref 6.5–8.1)

## 2018-04-04 LAB — I-STAT BETA HCG BLOOD, ED (MC, WL, AP ONLY)

## 2018-04-04 MED ORDER — SODIUM CHLORIDE 0.9 % IV BOLUS
500.0000 mL | Freq: Once | INTRAVENOUS | Status: AC
  Administered 2018-04-04: 500 mL via INTRAVENOUS

## 2018-04-04 MED ORDER — IOPAMIDOL (ISOVUE-370) INJECTION 76%
INTRAVENOUS | Status: AC
  Filled 2018-04-04: qty 100

## 2018-04-04 MED ORDER — MORPHINE SULFATE (PF) 4 MG/ML IV SOLN
6.0000 mg | Freq: Once | INTRAVENOUS | Status: AC
  Administered 2018-04-04: 6 mg via INTRAVENOUS
  Filled 2018-04-04: qty 2

## 2018-04-04 MED ORDER — IOPAMIDOL (ISOVUE-370) INJECTION 76%
100.0000 mL | Freq: Once | INTRAVENOUS | Status: AC | PRN
  Administered 2018-04-04: 100 mL via INTRAVENOUS

## 2018-04-04 NOTE — ED Notes (Addendum)
Taken to US at this time. 

## 2018-04-04 NOTE — ED Provider Notes (Signed)
MSE was initiated and I personally evaluated the patient and placed orders (if any) at  8:56 PM on April 04, 2018.  The patient appears stable so that the remainder of the MSE may be completed by another provider.  Patient placed in Quick Look pathway, seen and evaluated   Chief Complaint: Right upper quadrant abdominal pain  HPI:   Patient presents to ED for evaluation of 10-day history of right upper quadrant abdominal pain, nausea.  She was [place] to the hospital in [place] for what appears to be "infection in my intestines."  Chart review shows that she was septic from enteritis.  She was discharged with ciprofloxacin and Flagyl.  States that her right upper quadrant abdominal pain was never addressed.  She has never been told that she had gallbladder problems.  She is concerned about this.  Denies any bowel changes, vomiting, urinary symptoms, fever, sick contacts with similar symptoms.  ROS: Right upper quadrant abdominal pain  Physical Exam:   Gen: No distress  Neuro: Awake and Alert  Skin: Warm    Focused Exam: Tenderness palpation of the right upper quadrant with no rebound or guarding noted.  Does not appear dehydrated.  Bruising on abdomen that patient states is from heparin shots given during her admission.   Initiation of care has begun. The patient has been counseled on the process, plan, and necessity for staying for the completion/evaluation, and the remainder of the medical screening examination    Dietrich PatesKhatri, Amous Crewe, PA-C 04/04/18 2058    Gerhard MunchLockwood, Robert, MD 04/04/18 2252

## 2018-04-04 NOTE — ED Triage Notes (Signed)
The pt is c/o rt upper abd pain for 2=3 days she [place]  Nausea  lmp last week

## 2018-04-04 NOTE — ED Provider Notes (Signed)
[facility] EMERGENCY DEPARTMENT Provider Note   CSN: [id number] Arrival date & time: 04/04/18  2048     History   Chief Complaint Chief Complaint  Patient presents with  . Abdominal Pain    HPI Rhonda Good is a 27 y.o. female.  Patient presents with worsening right upper quadrant pain since being discharged from a hospital in [place] 1 week ago.  Patient was diagnosed with bowel infection she is unsure details she was given IV antibiotics and sent home on oral antibiotics.  Patient now has right upper quadrant pain worse than she had before and pleuritic component.  No blood clot history.  No leg swelling fevers or chills.  Patient has been taking her antibiotics.  Patient does not have known liver disease however she has had sexual intercourse with someone with hepatitis C in the past.  Patient was admitted for 4 days for antibiotics for urine infection and bowel infection.     Past Medical History:  Diagnosis Date  . Abnormal Pap smear of cervix   . Asthma   . Bipolar 1 disorder (HCC)   . Chronic back pain   . Drug abuse, IV (HCC)   . Drug overdose   . Ovarian cyst     Patient Active Problem List   Diagnosis Date Noted  . Abnormal Pap smear of cervix 12/18/2013  . Alcohol dependence (HCC) 08/23/2012  . Substance induced mood disorder (HCC) 08/23/2012  . Encephalopathy acute 08/20/2012  . Overdose 08/20/2012  . Hypotension 08/20/2012  . Alcohol intoxication (HCC) 08/20/2012  . Bipolar disorder (HCC) 08/20/2012    Past Surgical History:  Procedure Laterality Date  . HAND SURGERY    . MOUTH SURGERY    . ORIF TIBIA PLATEAU Left 07/25/2013   Procedure: OPEN REDUCTION INTERNAL FIXATION (ORIF) TIBIAL PLATEAU;  Surgeon: Toni Arthurs, MD;  Location: [facility] OR;  Service: Orthopedics;  Laterality: Left;     OB History    Gravida  1   Para      Term      Preterm      AB  1   Living        SAB      TAB      Ectopic      Multiple        Live Births               Home Medications    Prior to Admission medications   Medication Sig Start Date End Date Taking? Authorizing Provider  ciprofloxacin (CIPRO) 250 MG tablet Take 250 mg by mouth 2 (two) times daily. 03/28/18  Yes [provider]  metroNIDAZOLE (FLAGYL) 500 MG tablet Take 500 mg by mouth 3 (three) times daily. 03/28/18  Yes [provider]  oxyCODONE (OXY IR/ROXICODONE) 5 MG immediate release tablet Take 5 mg by mouth every 8 (eight) hours as needed for moderate pain.  03/28/18  Yes [provider]  albuterol (PROVENTIL HFA;VENTOLIN HFA) 108 (90 Base) MCG/ACT inhaler Inhale 2 puffs into the lungs every 4 (four) hours as needed for wheezing or shortness of breath. Patient not taking: Reported on 04/04/2018 06/22/17   Eber Hong, MD  azithromycin (ZITHROMAX Z-PAK) 250 MG tablet Take 1 tablet (250 mg total) by mouth daily. 500mg  PO day 1, then 250mg  PO days 205 Patient not taking: Reported on 04/04/2018 06/22/17   Eber Hong, MD    Family History Family History  Problem Relation Age of Onset  .  Diabetes Mother   . Hyperlipidemia Mother   . Cancer Maternal Grandfather   . Diabetes Paternal Grandmother   . Diabetes Maternal Aunt   . Diabetes Maternal Uncle     Social History Social History   Tobacco Use  . Smoking status: Current Every Day Smoker    Packs/day: 1.00    Years: 10.00    Pack years: 10.00    Types: Cigarettes  . Smokeless tobacco: Never Used  Substance Use Topics  . Alcohol use: Yes    Comment: occ  . Drug use: Yes    Types: IV    Comment: heroin     Allergies   Hydrocodone and Naproxen   Review of Systems Review of Systems  Constitutional: Positive for appetite change. Negative for chills and fever.  HENT: Negative for congestion.   Eyes: Negative for visual disturbance.  Respiratory: Negative for shortness of breath.   Cardiovascular: Negative for chest pain.  Gastrointestinal: Positive for  abdominal pain and nausea. Negative for vomiting.  Genitourinary: Negative for dysuria and flank pain.  Musculoskeletal: Negative for back pain, neck pain and neck stiffness.  Skin: Negative for rash.  Neurological: Negative for light-headedness and headaches.     Physical Exam Updated Vital Signs BP 138/85   Pulse (!) 52 Comment: ok to d/c per EDPA  Temp 97.6 F (36.4 C) (Axillary)   Resp 18   Ht 5\' 9"  (1.753 m)   Wt 88.5 kg (195 lb)   LMP 03/28/2018   SpO2 98%   BMI 28.80 kg/m   Physical Exam  Constitutional: She is oriented to person, place, and time. She appears well-developed and well-nourished.  HENT:  Head: Normocephalic and atraumatic.  Eyes: Conjunctivae are normal. Right eye exhibits no discharge. Left eye exhibits no discharge.  Neck: Normal range of motion. Neck supple. No tracheal deviation present.  Cardiovascular: Normal rate and regular rhythm.  Pulmonary/Chest: Effort normal and breath sounds normal.  Abdominal: Soft. She exhibits no distension. There is tenderness (Right upper quadrant and right flank mild). There is no guarding.  Musculoskeletal: She exhibits no edema.  Neurological: She is alert and oriented to person, place, and time.  Skin: Skin is warm. No rash noted.  Psychiatric: She has a normal mood and affect.  Nursing note and vitals reviewed.    ED Treatments / Results  Labs (all labs ordered are listed, but only abnormal results are displayed) Labs Reviewed  COMPREHENSIVE METABOLIC PANEL - Abnormal; Notable for the following components:      Result Value   Glucose, Bld 149 (*)    BUN <5 (*)    Albumin 3.4 (*)    AST 68 (*)    ALT 46 (*)    All other components within normal limits  URINALYSIS, ROUTINE W REFLEX MICROSCOPIC - Abnormal; Notable for the following components:   Color, Urine STRAW (*)    Specific Gravity, Urine 1.002 (*)    All other components within normal limits  CBC WITH DIFFERENTIAL/PLATELET  LIPASE, BLOOD    HEPATITIS PANEL, ACUTE  I-STAT BETA HCG BLOOD, ED ([facility], [facility], [facility] ONLY)    EKG None  Radiology No results found.  Procedures Procedures (including critical care time)  Medications Ordered in ED Medications  morphine 4 MG/ML injection 6 mg (6 mg Intravenous Given 04/04/18 2258)  sodium chloride 0.9 % bolus 500 mL (0 mLs Intravenous Stopped 04/05/18 0004)  iopamidol (ISOVUE-370) 76 % injection 100 mL (100 mLs Intravenous Contrast Given 04/04/18 2348)  morphine  4 MG/ML injection 4 mg (4 mg Intravenous Given 04/05/18 0053)     Initial Impression / Assessment and Plan / ED Course  I have reviewed the triage vital signs and the nursing notes.  Pertinent labs & imaging results that were available during my care of the patient were reviewed by me and considered in my medical decision making (see chart for details).    Patient presents with worsening right upper quadrant pain and pleuritic pain since discharge from the hospital for bowel infection.  Blood work reviewed overall unremarkable, ultrasound performed no signs of acute issues with the gallbladder.  With worsening symptoms plan for CT scan to look for worsening infection/abscess in addition looking for blood clot in the lungs with pleuritic component.    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

## 2018-04-05 MED ORDER — MORPHINE SULFATE (PF) 4 MG/ML IV SOLN
4.0000 mg | Freq: Once | INTRAVENOUS | Status: AC
  Administered 2018-04-05: 4 mg via INTRAVENOUS
  Filled 2018-04-05: qty 1

## 2018-04-05 NOTE — Discharge Instructions (Signed)
Continue medications as previously prescribed.  Follow-up with gastroenterology in the next few days if symptoms are not improving, and return to the ER if symptoms significantly worsen or change.  The contact information for both the gastro[place]ology clinic in [place] and [facility] here in [place] have been provided for you to call and make the above arrangements.

## 2018-04-05 NOTE — ED Triage Notes (Signed)
Care assumed from Dr. Jodi MourningZavitz at shift change.  Patient awaiting results of CT scans to rule out both PE and intra-abdominal pathology.  PE study is negative and CT scan of the abdomen and pelvis only shows inflammation in the pelvis that appears to be related to an omental infarct.  The findings of colitis on her CT scan from the outside facility have resolved.  She has no tenderness in the pelvic region, only in the right upper quadrant.  At this point, I see no indication for further testing or admission.  She will be discharged with follow-up with GI.  She also has a hepatitis panel pending that likely will not result until tomorrow.  If this is abnormal, she will be informed of this.

## 2018-04-08 LAB — HEPATITIS PANEL, ACUTE
HCV Ab: 0.1 s/co ratio (ref 0.0–0.9)
HEP A IGM: NEGATIVE
HEP B C IGM: NEGATIVE
HEP B S AG: NEGATIVE

## 2018-04-08 NOTE — ED Notes (Signed)
Pt. Called for results of her blood work, Results reviewed with pt.  All questions answered. 02/06/2018

## 2019-03-04 ENCOUNTER — Other Ambulatory Visit (HOSPITAL_COMMUNITY): Payer: Self-pay | Admitting: Family Medicine

## 2019-03-04 DIAGNOSIS — N6325 Unspecified lump in the left breast, overlapping quadrants: Secondary | ICD-10-CM

## 2019-03-24 ENCOUNTER — Other Ambulatory Visit: Payer: Self-pay

## 2019-03-24 ENCOUNTER — Other Ambulatory Visit (HOSPITAL_COMMUNITY): Payer: Self-pay

## 2019-03-24 ENCOUNTER — Ambulatory Visit (HOSPITAL_COMMUNITY)
Admission: RE | Admit: 2019-03-24 | Discharge: 2019-03-24 | Disposition: A | Discharge status: Home / Self Care | Payer: Self-pay | Source: Ambulatory Visit | Attending: Family Medicine | Admitting: Family Medicine

## 2019-03-24 DIAGNOSIS — N6325 Unspecified lump in the left breast, overlapping quadrants: Secondary | ICD-10-CM

## 2019-03-24 DIAGNOSIS — N6320 Unspecified lump in the left breast, unspecified quadrant: Secondary | ICD-10-CM | POA: Insufficient documentation

## 2019-04-01 ENCOUNTER — Encounter (HOSPITAL_COMMUNITY): Payer: Self-pay | Admitting: Emergency Medicine

## 2019-04-01 ENCOUNTER — Emergency Department (HOSPITAL_COMMUNITY): Payer: Self-pay

## 2019-04-01 ENCOUNTER — Emergency Department (HOSPITAL_COMMUNITY)
Admission: EM | Admit: 2019-04-01 | Discharge: 2019-04-01 | Disposition: A | Discharge status: Home / Self Care | Payer: Self-pay | Attending: Emergency Medicine | Admitting: Emergency Medicine

## 2019-04-01 ENCOUNTER — Other Ambulatory Visit: Payer: Self-pay

## 2019-04-01 DIAGNOSIS — I951 Orthostatic hypotension: Secondary | ICD-10-CM | POA: Insufficient documentation

## 2019-04-01 DIAGNOSIS — M25562 Pain in left knee: Secondary | ICD-10-CM | POA: Insufficient documentation

## 2019-04-01 DIAGNOSIS — J45909 Unspecified asthma, uncomplicated: Secondary | ICD-10-CM | POA: Insufficient documentation

## 2019-04-01 DIAGNOSIS — R55 Syncope and collapse: Secondary | ICD-10-CM

## 2019-04-01 DIAGNOSIS — E860 Dehydration: Secondary | ICD-10-CM | POA: Insufficient documentation

## 2019-04-01 DIAGNOSIS — Z79899 Other long term (current) drug therapy: Secondary | ICD-10-CM | POA: Insufficient documentation

## 2019-04-01 DIAGNOSIS — E876 Hypokalemia: Secondary | ICD-10-CM | POA: Insufficient documentation

## 2019-04-01 DIAGNOSIS — G8929 Other chronic pain: Secondary | ICD-10-CM | POA: Insufficient documentation

## 2019-04-01 DIAGNOSIS — F17210 Nicotine dependence, cigarettes, uncomplicated: Secondary | ICD-10-CM | POA: Insufficient documentation

## 2019-04-01 LAB — BASIC METABOLIC PANEL
Anion gap: 12 (ref 5–15)
BUN: 13 mg/dL (ref 6–20)
CO2: 22 mmol/L (ref 22–32)
Calcium: 9 mg/dL (ref 8.9–10.3)
Chloride: 98 mmol/L (ref 98–111)
Creatinine, Ser: 0.67 mg/dL (ref 0.44–1.00)
GFR calc Af Amer: >60 mL/min (ref >60)
GFR calc non Af Amer: >60 mL/min (ref >60)
Glucose, Bld: 105 mg/dL — ABNORMAL HIGH (ref 70–99)
Potassium: 3 mmol/L — ABNORMAL LOW (ref 3.5–5.1)
Sodium: 132 mmol/L — ABNORMAL LOW (ref 135–145)

## 2019-04-01 LAB — URINALYSIS, ROUTINE W REFLEX MICROSCOPIC
Bacteria, UA: NONE SEEN
Bilirubin Urine: NEGATIVE
Glucose, UA: NEGATIVE mg/dL
Hgb urine dipstick: NEGATIVE
Ketones, ur: NEGATIVE mg/dL
Leukocytes,Ua: NEGATIVE
Nitrite: NEGATIVE
Protein, ur: 30 mg/dL — AB
Specific Gravity, Urine: 1.041 — ABNORMAL HIGH (ref 1.005–1.030)
pH: 5 (ref 5.0–8.0)

## 2019-04-01 LAB — CBC
HCT: 42.8 % (ref 36.0–46.0)
Hemoglobin: 13.6 g/dL (ref 12.0–15.0)
MCH: 29.2 pg (ref 26.0–34.0)
MCHC: 31.8 g/dL (ref 30.0–36.0)
MCV: 91.8 fL (ref 80.0–100.0)
Platelets: 299 10*3/uL (ref 150–400)
RBC: 4.66 MIL/uL (ref 3.87–5.11)
RDW: 13.4 % (ref 11.5–15.5)
WBC: 8.5 10*3/uL (ref 4.0–10.5)
nRBC: 0 % (ref 0.0–0.2)

## 2019-04-01 LAB — POC URINE PREG, ED: Preg Test, Ur: NEGATIVE

## 2019-04-01 LAB — CBG MONITORING, ED: Glucose-Capillary: 91 mg/dL (ref 70–99)

## 2019-04-01 MED ORDER — POTASSIUM CHLORIDE CRYS ER 20 MEQ PO TBCR: 20.0000 meq | EXTENDED_RELEASE_TABLET | Freq: Every day | ORAL | 0 refills | Status: AC

## 2019-04-01 MED ORDER — SODIUM CHLORIDE 0.9% FLUSH: 3.0000 mL | Freq: Once | INTRAVENOUS | Status: DC

## 2019-04-01 MED ORDER — OXYCODONE-ACETAMINOPHEN 5-325 MG PO TABS
2.0000 | ORAL_TABLET | Freq: Once | ORAL | Status: AC
  Administered 2019-04-01: 2 via ORAL
  Filled 2019-04-01: qty 2

## 2019-04-01 MED ORDER — SODIUM CHLORIDE 0.9 % IV BOLUS
1000.0000 mL | Freq: Once | INTRAVENOUS | Status: AC
  Administered 2019-04-01: 1000 mL via INTRAVENOUS

## 2019-04-01 MED ORDER — POTASSIUM CHLORIDE CRYS ER 20 MEQ PO TBCR
40.0000 meq | EXTENDED_RELEASE_TABLET | Freq: Once | ORAL | Status: AC
  Administered 2019-04-01: 40 meq via ORAL
  Filled 2019-04-01: qty 2

## 2019-04-01 MED ORDER — HYDROXYZINE HCL 25 MG PO TABS: 25.0000 mg | ORAL_TABLET | Freq: Four times a day (QID) | ORAL | 0 refills | Status: DC

## 2019-04-01 MED ORDER — LORAZEPAM 1 MG PO TABS
1.0000 mg | ORAL_TABLET | Freq: Once | ORAL | Status: AC
  Administered 2019-04-01: 1 mg via ORAL
  Filled 2019-04-01: qty 1

## 2019-04-01 NOTE — ED Notes (Signed)
Pt ambulatory to waiting room. Pt verbalized understanding of discharge instructions.   

## 2019-04-01 NOTE — ED Notes (Signed)
Pt has slurred speech and is continuously asking for pain meds for her knee pain, she was assisted to restroom with minimal assistance. Pt keeps hollering for her neighbor that she hears talking. I informed pt that her neighbor is not here we do not allow visitors at this time. Side rails up on bed and call light given to pt.

## 2019-04-01 NOTE — Discharge Instructions (Signed)
You were seen in the emergency department today after passing out.  We suspect your passing out was due to orthostatic hypotension which is caused by dehydration.  Please see the attached handouts regarding these diagnoses.  It is extremely important that you remain well-hydrated.  Please be sure to drink at minimum eight 8 ounce glasses of water per day.  Your potassium was noted to be low, we are sending you home with a supplement, please also follow diet guidelines attached.  We are also sending you home with hydroxyzine, this is a medicine for anxiety, please take this every 6 hours as needed for anxiety.  We have prescribed you new medication(s) today. Discuss the medications prescribed today with your pharmacist as they can have adverse effects and interactions with your other medicines including over the counter and prescribed medications. Seek medical evaluation if you start to experience new or abnormal symptoms after taking one of these medicines, seek care immediately if you start to experience difficulty breathing, feeling of your throat closing, facial swelling, or rash as these could be indications of a more serious allergic reaction  Please follow-up with your primary care provider within the next 2 days.  Return to the ER for new or worsening symptoms or any other concerns.

## 2019-04-01 NOTE — ED Triage Notes (Signed)
Patient reports feeling ill since this am. C/O syncopal episode after standing up. Patient stated she thought her sugar was low, checked in Triage, 91.

## 2019-04-01 NOTE — ED Provider Notes (Signed)
[facility] EMERGENCY DEPARTMENT Provider Note   CSN: [id number] Arrival date & time: 04/01/19  1738     History   Chief Complaint Chief Complaint  Patient presents with  . Loss of Consciousness    HPI Rhonda Good is a 28 y.o. female with a hx of tobacco abuse, IVDU, asthma, bipolar 1 disorder, & EtOH abuse who presents to the ED s/p syncopal episode which occurred shortly PTA. Patient states that she was sitting, transitioned to standing, became lightheaded & then passed out. She states she was only out for a few seconds, did fall to the ground, but did not hit her head (was witnessed by family), she did however injure her L knee resulting in acute on chronic pain. When she came back to she felt lightheaded, scared, upset, & a bit shaky all over. Family witnessed no seizure activity, they helped her up & decision was made to come to the ED. While in the car she was leaning forward, when she sat back up she felt a bit lightheaded again but did not have re-occurrence of syncope. Other than position changes no alleviating/aggravating factors. They were concerned this could be hypoglycemia related- she has no formally diagnosed hx of DM. Currently patient still feels a bit lightheaded in certain positions, she is having L knee pain, is very frightened about what happened, & feels shaky. Denies chest pain or dyspnea at present or associated w/ syncope. Denies change in vision, numbness, focal weakness, emesis, diarrhea, or abdominal pain. Upon further questioning patient states she has only eaten crackers & drank 1 pepsi today in terms of PO intake. She denies current illicit substance use or daily EtOH    HPI  Past Medical History:  Diagnosis Date  . Abnormal Pap smear of cervix   . Asthma   . Bipolar 1 disorder (HCC)   . Chronic back pain   . Drug abuse, IV (HCC)   . Drug overdose   . Ovarian cyst     Patient Active Problem List   Diagnosis Date Noted  . Abnormal Pap smear of cervix  12/18/2013  . Alcohol dependence (HCC) 08/23/2012  . Substance induced mood disorder (HCC) 08/23/2012  . Encephalopathy acute 08/20/2012  . Overdose 08/20/2012  . Hypotension 08/20/2012  . Alcohol intoxication (HCC) 08/20/2012  . Bipolar disorder (HCC) 08/20/2012    Past Surgical History:  Procedure Laterality Date  . HAND SURGERY    . MOUTH SURGERY    . ORIF TIBIA PLATEAU Left 07/25/2013   Procedure: OPEN REDUCTION INTERNAL FIXATION (ORIF) TIBIAL PLATEAU;  Surgeon: Toni ArthursJohn Hewitt, MD;  Location: [facility] OR;  Service: Orthopedics;  Laterality: Left;     OB History    Gravida  1   Para      Term      Preterm      AB  1   Living        SAB      TAB      Ectopic      Multiple      Live Births               Home Medications    Prior to Admission medications   Medication Sig Start Date End Date Taking? Authorizing Provider  albuterol (PROVENTIL HFA;VENTOLIN HFA) 108 (90 Base) MCG/ACT inhaler Inhale 2 puffs into the lungs every 4 (four) hours as needed for wheezing or shortness of breath. Patient not taking: Reported on 04/04/2018 06/22/17   Eber HongMiller, Brian, MD  azithromycin (ZITHROMAX Z-PAK) 250 MG tablet Take 1 tablet (250 mg total) by mouth daily. 500mg  PO day 1, then 250mg  PO days 205 Patient not taking: Reported on 04/04/2018 06/22/17   Noemi Chapel, MD  ciprofloxacin (CIPRO) 250 MG tablet Take 250 mg by mouth 2 (two) times daily. 03/28/18   [provider]  metroNIDAZOLE (FLAGYL) 500 MG tablet Take 500 mg by mouth 3 (three) times daily. 03/28/18   [provider]  oxyCODONE (OXY IR/ROXICODONE) 5 MG immediate release tablet Take 5 mg by mouth every 8 (eight) hours as needed for moderate pain.  03/28/18   [provider]    Family History Family History  Problem Relation Age of Onset  . Diabetes Mother   . Hyperlipidemia Mother   . Cancer Maternal Grandfather   . Diabetes Paternal Grandmother   . Diabetes Maternal Aunt   . Diabetes Maternal  Uncle     Social History Social History   Tobacco Use  . Smoking status: Current Every Day Smoker    Packs/day: 1.00    Years: 10.00    Pack years: 10.00    Types: Cigarettes  . Smokeless tobacco: Never Used  Substance Use Topics  . Alcohol use: Yes    Comment: occ  . Drug use: Not Currently    Types: IV    Comment: heroin     Allergies   Hydrocodone and Naproxen   Review of Systems Review of Systems  Constitutional: Negative for chills and fever.  Respiratory: Negative for shortness of breath.   Cardiovascular: Negative for chest pain.  Gastrointestinal: Negative for abdominal pain, diarrhea, nausea and vomiting.  Genitourinary: Negative for dysuria.  Musculoskeletal: Positive for arthralgias (L knee).  Skin: Negative for wound.  Neurological: Positive for syncope and light-headedness. Negative for dizziness, seizures, facial asymmetry, speech difficulty, weakness, numbness and headaches.  Psychiatric/Behavioral: Negative for suicidal ideas. The patient is nervous/anxious.   All other systems reviewed and are negative.  Physical Exam Updated Vital Signs BP 122/81 (BP Location: Left Arm)   Pulse 98   Temp 98.3 F (36.8 C) (Oral)   Resp 16   LMP 03/18/2019   SpO2 98%   Physical Exam Vitals signs and nursing note reviewed.  Constitutional:      General: She is not in acute distress.    Appearance: She is not ill-appearing or toxic-appearing.  HENT:     Head: Normocephalic and atraumatic.     Comments: No racoon eyes or battle sign.     Ears:     Comments: No hemotympanum.  Eyes:     Extraocular Movements: Extraocular movements intact.     Pupils: Pupils are equal, round, and reactive to light.  Neck:     Musculoskeletal: Normal range of motion. No neck rigidity or muscular tenderness.     Comments: No midline tenderness or palpable step off.  Cardiovascular:     Rate and Rhythm: Normal rate and regular rhythm.     Pulses:          Dorsalis pedis  pulses are 2+ on the right side and 2+ on the left side.       Posterior tibial pulses are 2+ on the right side and 2+ on the left side.     Heart sounds: No murmur. No friction rub. No gallop.   Pulmonary:     Effort: Pulmonary effort is normal. No respiratory distress.     Breath sounds: No stridor. No wheezing, rhonchi or rales.  Chest:  Chest wall: No tenderness.  Abdominal:     General: There is no distension.     Palpations: Abdomen is soft.     Tenderness: There is no abdominal tenderness. There is no guarding or rebound.  Musculoskeletal:     Comments: Upper extremities: Intact AROM without point/focal tenderness Back: No midline tenderness or palpable step off.  Lower extremities: No obvious deformity, appreciable swelling, edema, erythema, ecchymosis, warmth, or open wounds. Patient has intact AROM to bilateral hips, knees, ankles, and all digits. Tender to palpation along medial aspect of L knee. Otherwise nontender.   Skin:    General: Skin is warm and dry.     Capillary Refill: Capillary refill takes less than 2 seconds.  Neurological:     Mental Status: She is alert.     Comments: Alert. Clear speech. No facial droop. CNIII-XII grossly intact. Bilateral upper and lower extremities' sensation grossly intact. 5/5 symmetric strength with grip strength and with plantar and dorsi flexion bilaterally.     Psychiatric:        Mood and Affect: Mood is anxious.        Behavior: Behavior normal.        Thought Content: Thought content does not include homicidal or suicidal ideation.     Comments: Patient tearful, very anxious appearing.     ED Treatments / Results  Labs (all labs ordered are listed, but only abnormal results are displayed) Labs Reviewed  BASIC METABOLIC PANEL - Abnormal; Notable for the following components:      Result Value   Sodium 132 (*)    Potassium 3.0 (*)    Glucose, Bld 105 (*)    All other components within normal limits  URINALYSIS, ROUTINE W  REFLEX MICROSCOPIC - Abnormal; Notable for the following components:   Color, Urine [place] (*)    APPearance HAZY (*)    Specific Gravity, Urine 1.041 (*)    Protein, ur 30 (*)    All other components within normal limits  CBC  CBG MONITORING, ED  CBG MONITORING, ED  POC URINE PREG, ED    EKG EKG Interpretation  Date/Time:  Wednesday April 01 2019 18:07:05 EDT Ventricular Rate:  89 PR Interval:    QRS Duration: 93 QT Interval:  372 QTC Calculation: 453 R Axis:   15 Text Interpretation:  Sinus rhythm Confirmed by Donnetta Hutchingook, Brian ([id number]) on 04/01/2019 6:45:25 PM   Radiology Dg Knee Complete 4 Views Left  Result Date: 04/01/2019 CLINICAL DATA:  Pain EXAM: LEFT KNEE - COMPLETE 4+ VIEW COMPARISON:  September 01, 2014 FINDINGS: Frontal, lateral, and bilateral oblique views were obtained. There is postoperative change in the proximal tibia, stable. No fracture is evident. There is lateral patellar subluxation. The patella does not appear frankly dislocated. There is no appreciable joint effusion. Joint spaces appear unremarkable. No erosive change. IMPRESSION: Lateral patellar subluxation. No fracture or joint effusion. Postoperative change noted in the proximal tibia. No appreciable joint space narrowing or erosion. Electronically Signed   By: Bretta BangWilliam  Woodruff III M.D.   On: 04/01/2019 19:27    Procedures Procedures (including critical care time)  Medications Ordered in ED Medications  sodium chloride flush (NS) 0.9 % injection 3 mL (has no administration in time range)  sodium chloride 0.9 % bolus 1,000 mL (0 mLs Intravenous Stopped 04/01/19 2024)  LORazepam (ATIVAN) tablet 1 mg (1 mg Oral Given 04/01/19 1846)  sodium chloride 0.9 % bolus 1,000 mL (0 mLs Intravenous Stopped 04/01/19 2132)  potassium chloride  SA (K-DUR) CR tablet 40 mEq (40 mEq Oral Given 04/01/19 2030)  oxyCODONE-acetaminophen (PERCOCET/ROXICET) 5-325 MG per tablet 2 tablet (2 tablets Oral Given 04/01/19 2031)     Initial  Impression / Assessment and Plan / ED Course  I have reviewed the triage vital signs and the nursing notes.  Pertinent labs & imaging results that were available during my care of the patient were reviewed by me and considered in my medical decision making (see chart for details).   Patient presents to the ED s/p syncope. Nontoxic appearing, initial vitals WNL. She is very anxious & tearful. No reports of head injury, reassuring exam w/o focal neuro deficits, battle sign, racoon eyes, or hemotympanum. No midline tenderness to the spine. Does have some L knee medial tenderness- NVI distally, will x-ray. CBG in triage 6591- doubt syncope secondary to hypoglycemia. Plan for orthostatic vitals, basic labs, EKG, & cardiac monitor. Ativan ordered for anxiety, fluids ordered as I anticipate positive orthostatics w/ poor PO intake today & w/ syncope w/ position change.   CBC: No leukocytosis or anemia.  BMP: Mild hyponatremia @ 132- receiving fluids. Hypokalemia @ 3.0- oral replacement.  Pregnancy test: Negative- doubt ectopic UA: Consistent w/ dehydration w/ elevated specific gravity Orthostatic vital signs: positive--> receiving fluids & will recheck.  EKG sinus rhythm Cardiac monitor: No significant arrhythmias.  L knee x-ray:  Lateral patellar subluxation. No fracture or joint effusion. Postoperative change noted in the proximal tibia. No appreciable joint space narrowing or erosion--> no dislocation. Good ROM. Medial tenderness is mild. Able to weightbear. NVI distally. Patient requested her prescribed oxycodone for chronic knee/back pain, [place] Controlled Substance reporting System queried, this was given.     Orthostatic VS for the past 24 hrs:  BP- Lying Pulse- Lying BP- Sitting Pulse- Sitting BP- Standing at 0 minutes Pulse- Standing at 0 minutes  04/01/19 2132 94/67 86 114/46 93 115/71 84  04/01/19 1821 117/77 89 (!) 89/72 94 (!) 110/99 112   Orthostatic vital signs improved following  2L of fluids. Symptomatically improved as well. She remains anxious at times, has had a lot going on and this scared her per our conversation, denies SI/HI/hallucinations. Appears appropriate for discharge, she has a PCP follow up tomorrow AM already scheduled. Prescriptions for potassium supplement & PRN atarax provided. Strict return precautions. I discussed results, treatment plan, need for follow-up, and return precautions with the patient. Provided opportunity for questions, patient confirmed understanding and is in agreement with plan.   Findings and plan of care discussed with supervising physician Dr. Adriana Simasook who has personally evaluated patient & is in agreement.   Final Clinical Impressions(s) / ED Diagnoses   Final diagnoses:  Orthostatic hypotension  Syncope, unspecified syncope type  Dehydration  Hypokalemia    ED Discharge Orders         Ordered    potassium chloride SA (K-DUR) 20 MEQ tablet  Daily     04/01/19 2141    hydrOXYzine (ATARAX/VISTARIL) 25 MG tablet  Every 6 hours     04/01/19 2141           Cherly Andersonetrucelli, Tymia Streb R, PA-C 04/01/19 2148    Donnetta Hutchingook, Brian, MD 04/02/19 1526

## 2019-06-15 ENCOUNTER — Other Ambulatory Visit: Payer: Self-pay

## 2019-06-15 ENCOUNTER — Encounter (HOSPITAL_COMMUNITY): Payer: Self-pay

## 2019-06-15 ENCOUNTER — Emergency Department (HOSPITAL_COMMUNITY)
Admission: EM | Admit: 2019-06-15 | Discharge: 2019-06-15 | Disposition: A | Discharge status: Home / Self Care | Payer: Self-pay | Attending: Emergency Medicine | Admitting: Emergency Medicine

## 2019-06-15 DIAGNOSIS — J4521 Mild intermittent asthma with (acute) exacerbation: Secondary | ICD-10-CM | POA: Insufficient documentation

## 2019-06-15 DIAGNOSIS — J020 Streptococcal pharyngitis: Secondary | ICD-10-CM | POA: Insufficient documentation

## 2019-06-15 DIAGNOSIS — F17210 Nicotine dependence, cigarettes, uncomplicated: Secondary | ICD-10-CM | POA: Insufficient documentation

## 2019-06-15 DIAGNOSIS — J45909 Unspecified asthma, uncomplicated: Secondary | ICD-10-CM | POA: Insufficient documentation

## 2019-06-15 LAB — GROUP A STREP BY PCR: Group A Strep by PCR: DETECTED — AB

## 2019-06-15 MED ORDER — PREDNISONE 20 MG PO TABS: 40.0000 mg | ORAL_TABLET | Freq: Every day | ORAL | 0 refills | Status: DC

## 2019-06-15 MED ORDER — PREDNISONE 20 MG PO TABS
40.0000 mg | ORAL_TABLET | Freq: Once | ORAL | Status: AC
  Administered 2019-06-15: 40 mg via ORAL
  Filled 2019-06-15: qty 2

## 2019-06-15 MED ORDER — PENICILLIN G BENZATHINE 1200000 UNIT/2ML IM SUSP
1.2000 10*6.[IU] | Freq: Once | INTRAMUSCULAR | Status: AC
  Administered 2019-06-15: 16:00:00 1.2 10*6.[IU] via INTRAMUSCULAR
  Filled 2019-06-15: qty 2

## 2019-06-15 MED ORDER — ONDANSETRON HCL 4 MG PO TABS
4.0000 mg | ORAL_TABLET | Freq: Once | ORAL | Status: AC
  Administered 2019-06-15: 4 mg via ORAL
  Filled 2019-06-15: qty 1

## 2019-06-15 MED ORDER — ALBUTEROL SULFATE HFA 108 (90 BASE) MCG/ACT IN AERS
4.0000 | INHALATION_SPRAY | Freq: Once | RESPIRATORY_TRACT | Status: AC
  Administered 2019-06-15: 4 via RESPIRATORY_TRACT
  Filled 2019-06-15: qty 6.7

## 2019-06-15 NOTE — ED Provider Notes (Signed)
[facility] EMERGENCY DEPARTMENT Provider Note   CSN: [id number] Arrival date & time: 06/15/19  1448     History   Chief Complaint Chief Complaint  Patient presents with  . Sore Throat    HPI Rhonda Good is a 28 y.o. female.     The history is provided by the patient.  Sore Throat This is a new problem. The current episode started more than 2 days ago. The problem occurs constantly. The problem has been gradually worsening. Associated symptoms include headaches. Pertinent negatives include no chest pain, no abdominal pain and no shortness of breath. Associated symptoms comments: bodyaches. The symptoms are aggravated by swallowing. Nothing relieves the symptoms. She has tried acetaminophen (salt water gargles) for the symptoms. The treatment provided no relief.    Past Medical History:  Diagnosis Date  . Abnormal Pap smear of cervix   . Asthma   . Bipolar 1 disorder ([place])   . Chronic back pain   . Drug abuse, IV ([place])   . Drug overdose   . Ovarian cyst     Patient Active Problem List   Diagnosis Date Noted  . Abnormal Pap smear of cervix 12/18/2013  . Alcohol dependence ([place]) 08/23/2012  . Substance induced mood disorder ([place]) 08/23/2012  . Encephalopathy acute 08/20/2012  . Overdose 08/20/2012  . Hypotension 08/20/2012  . Alcohol intoxication ([place]) 08/20/2012  . Bipolar disorder ([place]) 08/20/2012    Past Surgical History:  Procedure Laterality Date  . HAND SURGERY    . MOUTH SURGERY    . ORIF TIBIA PLATEAU Left 07/25/2013   Procedure: OPEN REDUCTION INTERNAL FIXATION (ORIF) TIBIAL PLATEAU;  Surgeon: Wylene Simmer, MD;  Location: [facility];  Service: Orthopedics;  Laterality: Left;     OB History    Gravida  1   Para      Term      Preterm      AB  1   Living        SAB      TAB      Ectopic      Multiple      Live Births               Home Medications    Prior to Admission medications   Medication Sig Start Date End Date Taking?  Authorizing Provider  acetaminophen (TYLENOL) 500 MG tablet Take 500 mg by mouth every 6 (six) hours as needed for mild pain or moderate pain.    [provider]  hydrOXYzine (ATARAX/VISTARIL) 25 MG tablet Take 1 tablet (25 mg total) by mouth every 6 (six) hours. 04/01/19   Petrucelli, Samantha R, PA-C  Oxycodone HCl 10 MG TABS Take 10 mg by mouth 4 (four) times daily.    [provider]  potassium chloride SA (K-DUR) 20 MEQ tablet Take 1 tablet (20 mEq total) by mouth daily. 04/01/19   Petrucelli, Glynda Jaeger, PA-C    Family History Family History  Problem Relation Age of Onset  . Diabetes Mother   . Hyperlipidemia Mother   . Cancer Maternal Grandfather   . Diabetes Paternal Grandmother   . Diabetes Maternal Aunt   . Diabetes Maternal Uncle     Social History Social History   Tobacco Use  . Smoking status: Current Every Day Smoker    Packs/day: 1.00    Years: 10.00    Pack years: 10.00    Types: Cigarettes  . Smokeless tobacco: Never Used  Substance Use Topics  .  Alcohol use: Yes    Comment: occ  . Drug use: Not Currently    Types: IV    Comment: heroin     Allergies   Hydrocodone and Naproxen   Review of Systems Review of Systems  Constitutional: Negative for activity change and appetite change.  HENT: Positive for sore throat. Negative for congestion, ear discharge, ear pain, facial swelling, nosebleeds, rhinorrhea, sneezing and tinnitus.   Eyes: Negative for photophobia, pain and discharge.  Respiratory: Negative for cough, choking, shortness of breath and wheezing.   Cardiovascular: Negative for chest pain, palpitations and leg swelling.  Gastrointestinal: Negative for abdominal pain, blood in stool, constipation, diarrhea, nausea and vomiting.  Genitourinary: Negative for difficulty urinating, dysuria, flank pain, frequency and hematuria.  Musculoskeletal: Positive for myalgias. Negative for back pain, gait problem and neck pain.  Skin: Negative  for color change, rash and wound.  Neurological: Positive for headaches. Negative for dizziness, seizures, syncope, facial asymmetry, speech difficulty, weakness and numbness.  Hematological: Negative for adenopathy. Does not bruise/bleed easily.  Psychiatric/Behavioral: Negative for agitation, confusion, hallucinations, self-injury and suicidal ideas. The patient is not nervous/anxious.      Physical Exam Updated Vital Signs BP (!) 146/99 (BP Location: Right Arm)   Pulse 94   Temp 98.2 F (36.8 C) (Oral)   Resp 12   Ht 5\' 6"  (1.676 m)   Wt 99.8 kg   LMP 05/18/2019   SpO2 95%   BMI 35.51 kg/m   Physical Exam Vitals signs and nursing note reviewed.  Constitutional:      Appearance: She is well-developed. She is not toxic-appearing.  HENT:     Head: Normocephalic.     Right Ear: Tympanic membrane and external ear normal.     Left Ear: Tympanic membrane and external ear normal.  Eyes:     General: Lids are normal.     Pupils: Pupils are equal, round, and reactive to light.  Neck:     Musculoskeletal: Normal range of motion and neck supple.     Vascular: No carotid bruit.  Cardiovascular:     Rate and Rhythm: Normal rate and regular rhythm.     Pulses: Normal pulses.     Heart sounds: Normal heart sounds.  Pulmonary:     Effort: No respiratory distress.     Breath sounds: Wheezing present.  Abdominal:     General: Bowel sounds are normal.     Palpations: Abdomen is soft.     Tenderness: There is no abdominal tenderness. There is no guarding.  Musculoskeletal: Normal range of motion.  Lymphadenopathy:     Head:     Right side of head: No submandibular adenopathy.     Left side of head: No submandibular adenopathy.     Cervical: No cervical adenopathy.  Skin:    General: Skin is warm and dry.  Neurological:     Mental Status: She is alert and oriented to person, place, and time.     Cranial Nerves: No cranial nerve deficit.     Sensory: No sensory deficit.   Psychiatric:        Speech: Speech normal.      ED Treatments / Results  Labs (all labs ordered are listed, but only abnormal results are displayed) Labs Reviewed  GROUP A STREP BY PCR - Abnormal; Notable for the following components:      Result Value   Group A Strep by PCR DETECTED (*)    All other components within normal limits  EKG None  Radiology No results found.  Procedures Procedures (including critical care time)  Medications Ordered in ED Medications  penicillin g benzathine (BICILLIN LA) 1200000 UNIT/2ML injection 1.2 Million Units (has no administration in time range)  albuterol (VENTOLIN HFA) 108 (90 Base) MCG/ACT inhaler 4 puff (has no administration in time range)  predniSONE (DELTASONE) tablet 40 mg (has no administration in time range)  ondansetron (ZOFRAN) tablet 4 mg (has no administration in time range)     Initial Impression / Assessment and Plan / ED Course  I have reviewed the triage vital signs and the nursing notes.  Pertinent labs & imaging results that were available during my care of the patient were reviewed by me and considered in my medical decision making (see chart for details).          Final Clinical Impressions(s) / ED Diagnoses MDM  Vital signs reviewed.  Pulse oximetry is 95% on room air.  Within normal limits by my interpretation.  Patient has wheezing on exam.  She says she has a history of asthma, and she continues to smoke.  Patient will be treated with albuterol 2 puffs every 4 hours, and prednisone daily.  The patient presented with problems of sore throat, body aches, and headache.  The patient was found to have a positive strep test.  Patient was treated with Bicillin here in the emergency department.  I have asked the patient to maintain her social distancing, and to use her mask over the next few days.  I have asked her to wash hands frequently, and made her aware that this is highly contagious.  Patient  acknowledges understanding of these instructions and is in agreement.   Final diagnoses:  Strep pharyngitis  Mild intermittent asthma with exacerbation    ED Discharge Orders    None       Ivery QualeBryant, Alaa Eyerman, PA-C 06/15/19 1647    Bethann BerkshireZammit, Joseph, MD 06/18/19 1105

## 2019-06-15 NOTE — ED Triage Notes (Signed)
Pt started having a sore throat on Friday. States white exudate on the back of her tonsils as well.

## 2019-06-15 NOTE — Discharge Instructions (Addendum)
Your strep test is positive.  You were treated here in the emergency department with Bicillin.  Your examination reveals wheezing of the lungs.  This probably represents an exacerbation of your asthma, or worsening because of your smoking.  Please use 2 puffs of albuterol every 4 hours.  Use prednisone daily with food until all taken.  Salt water gargles will be helpful.  Chloraseptic spray will also be helpful.  Use Tylenol every 4 hours or ibuprofen every 6 hours for fever, and/or aching.  Please use your mask, and keep your social distancing from others to prevent spread of the strep infection.

## 2020-08-07 ENCOUNTER — Other Ambulatory Visit: Payer: Self-pay | Admitting: Nurse Practitioner

## 2020-08-07 DIAGNOSIS — U071 COVID-19: Secondary | ICD-10-CM

## 2020-08-07 NOTE — Progress Notes (Signed)
I connected by phone with Rhonda Good on 08/07/2020 at 11:40 AM to discuss the potential use of a treatment for mild to moderate COVID-19 viral infection in non-hospitalized patients.  This patient is a 29 y.o. female that meets the FDA criteria for Emergency Use Authorization of bamlanivimab/etesevimab, casirivimab\imdevimab, or sotrovimab  Has a (+) direct SARS-CoV-2 viral test result  Has mild or moderate COVID-19   Is ? 29 years of age and weighs ? 40 kg  Is NOT hospitalized due to COVID-19  Is NOT requiring oxygen therapy or requiring an increase in baseline oxygen flow rate due to COVID-19  Is within 10 days of symptom onset  Has at least one of the high risk factor(s) for progression to severe COVID-19 and/or hospitalization as defined in EUA.  Specific high risk criteria : BMI > 25 and Other high risk medical condition per CDC:  SVI   I have spoken and communicated the following to the patient or parent/caregiver:  1. FDA has authorized the emergency use of bamlanivimab/etesevimab, casirivimab\imdevimab, or sotrovimab for the treatment of mild to moderate COVID-19 in adults and pediatric patients with positive results of direct SARS-CoV-2 viral testing who are 62 years of age and older weighing at least 40 kg, and who are at high risk for progressing to severe COVID-19 and/or hospitalization.  2. The significant known and potential risks and benefits of bamlanivimab/etesevimab, casirivimab\imdevimab, or sotrovimab, and the extent to which such potential risks and benefits are unknown.  3. Information on available alternative treatments and the risks and benefits of those alternatives, including clinical trials.  4. Patients treated with bamlanivimab/etesevimab, casirivimab\imdevimab, or sotrovimab should continue to self-isolate and use infection control measures (e.g., wear mask, isolate, social distance, avoid sharing personal items, clean and disinfect "high touch"  surfaces, and frequent handwashing) according to CDC guidelines.   5. The patient or parent/caregiver has the option to accept or refuse bamlanivimab/etesevimab, casirivimab\imdevimab, or sotrovimab.  After reviewing this information with the patient, the patient has agreed to receive one of the available covid 19 monoclonal antibodies and will be provided an appropriate fact sheet prior to infusion.[name], DNP, AGNP-C [phone] (Infusion Center Hotline)

## 2020-08-08 ENCOUNTER — Ambulatory Visit (HOSPITAL_COMMUNITY)
Admission: RE | Admit: 2020-08-08 | Discharge: 2020-08-08 | Disposition: A | Discharge status: Home / Self Care | Payer: Self-pay | Source: Ambulatory Visit | Attending: Pulmonary Disease | Admitting: Pulmonary Disease

## 2020-08-08 DIAGNOSIS — U071 COVID-19: Secondary | ICD-10-CM | POA: Insufficient documentation

## 2020-08-08 DIAGNOSIS — Z6825 Body mass index (BMI) 25.0-25.9, adult: Secondary | ICD-10-CM | POA: Insufficient documentation

## 2020-08-08 MED ORDER — DIPHENHYDRAMINE HCL 50 MG/ML IJ SOLN: 50.0000 mg | Freq: Once | INTRAMUSCULAR | Status: DC | PRN

## 2020-08-08 MED ORDER — ALBUTEROL SULFATE HFA 108 (90 BASE) MCG/ACT IN AERS: 2.0000 | INHALATION_SPRAY | Freq: Once | RESPIRATORY_TRACT | Status: DC | PRN

## 2020-08-08 MED ORDER — SODIUM CHLORIDE 0.9 % IV BOLUS
1000.0000 mL | Freq: Once | INTRAVENOUS | Status: AC
  Administered 2020-08-08: 1000 mL via INTRAVENOUS

## 2020-08-08 MED ORDER — EPINEPHRINE 0.3 MG/0.3ML IJ SOAJ: 0.3000 mg | Freq: Once | INTRAMUSCULAR | Status: DC | PRN

## 2020-08-08 MED ORDER — ONDANSETRON HCL 4 MG/2ML IJ SOLN
4.0000 mg | Freq: Once | INTRAMUSCULAR | Status: AC
  Administered 2020-08-08: 4 mg via INTRAVENOUS
  Filled 2020-08-08: qty 2

## 2020-08-08 MED ORDER — SODIUM CHLORIDE 0.9 % IV SOLN: INTRAVENOUS | Status: DC | PRN

## 2020-08-08 MED ORDER — FAMOTIDINE IN NACL 20-0.9 MG/50ML-% IV SOLN: 20.0000 mg | Freq: Once | INTRAVENOUS | Status: DC | PRN

## 2020-08-08 MED ORDER — ACETAMINOPHEN 325 MG PO TABS
650.0000 mg | ORAL_TABLET | Freq: Four times a day (QID) | ORAL | Status: DC | PRN
  Administered 2020-08-08: 650 mg via ORAL
  Filled 2020-08-08: qty 2

## 2020-08-08 MED ORDER — METHYLPREDNISOLONE SODIUM SUCC 125 MG IJ SOLR: 125.0000 mg | Freq: Once | INTRAMUSCULAR | Status: DC | PRN

## 2020-08-08 MED ORDER — SOTROVIMAB 500 MG/8ML IV SOLN
500.0000 mg | Freq: Once | INTRAVENOUS | Status: AC
  Administered 2020-08-08: 500 mg via INTRAVENOUS

## 2020-08-08 NOTE — Discharge Instructions (Signed)
10 Things You Can Do to Manage Your COVID-19 Symptoms at Home If you have possible or confirmed COVID-19: 1. Stay home from work and school. And stay away from other public places. If you must go out, avoid using any kind of public transportation, ridesharing, or taxis. 2. Monitor your symptoms carefully. If your symptoms get worse, call your healthcare provider immediately. 3. Get rest and stay hydrated. 4. If you have a medical appointment, call the healthcare provider ahead of time and tell them that you have or may have COVID-19. 5. For medical emergencies, call 911 and notify the dispatch personnel that you have or may have COVID-19. 6. Cover your cough and sneezes with a tissue or use the inside of your elbow. 7. Wash your hands often with soap and water for at least 20 seconds or clean your hands with an alcohol-based hand sanitizer that contains at least 60% alcohol. 8. As much as possible, stay in a specific room and away from other people in your home. Also, you should use a separate bathroom, if available. If you need to be around other people in or outside of the home, wear a mask. 9. Avoid sharing personal items with other people in your household, like dishes, towels, and bedding. 10. Clean all surfaces that are touched often, like counters, tabletops, and doorknobs. Use household cleaning sprays or wipes according to the label instructions. cdc.gov/coronavirus 03/25/2019 This information is not intended to replace advice given to you by your health care provider. Make sure you discuss any questions you have with your health care provider. Document Revised: 08/27/2019 Document Reviewed: 08/27/2019 Elsevier Patient Education  2020 Elsevier Inc.   COVID-19 COVID-19 is a respiratory infection that is caused by a virus called severe acute respiratory syndrome coronavirus 2 (SARS-CoV-2). The disease is also known as coronavirus disease or novel coronavirus. In some people, the virus may  not cause any symptoms. In others, it may cause a serious infection. The infection can get worse quickly and can lead to complications, such as:  Pneumonia, or infection of the lungs.  Acute respiratory distress syndrome or ARDS. This is a condition in which fluid build-up in the lungs prevents the lungs from filling with air and passing oxygen into the blood.  Acute respiratory failure. This is a condition in which there is not enough oxygen passing from the lungs to the body or when carbon dioxide is not passing from the lungs out of the body.  Sepsis or septic shock. This is a serious bodily reaction to an infection.  Blood clotting problems.  Secondary infections due to bacteria or fungus.  Organ failure. This is when your body's organs stop working. The virus that causes COVID-19 is contagious. This means that it can spread from person to person through droplets from coughs and sneezes (respiratory secretions). What are the causes? This illness is caused by a virus. You may catch the virus by:  Breathing in droplets from an infected person. Droplets can be spread by a person breathing, speaking, singing, coughing, or sneezing.  Touching something, like a table or a doorknob, that was exposed to the virus (contaminated) and then touching your mouth, nose, or eyes. What increases the risk? Risk for infection You are more likely to be infected with this virus if you:  Are within 6 feet (2 meters) of a person with COVID-19.  Provide care for or live with a person who is infected with COVID-19.  Spend time in crowded indoor spaces or   live in shared housing. Risk for serious illness You are more likely to become seriously ill from the virus if you:  Are 50 years of age or older. The higher your age, the more you are at risk for serious illness.  Live in a nursing home or long-term care facility.  Have cancer.  Have a long-term (chronic) disease such as: ? Chronic lung disease,  including chronic obstructive pulmonary disease or asthma. ? A long-term disease that lowers your body's ability to fight infection (immunocompromised). ? Heart disease, including heart failure, a condition in which the arteries that lead to the heart become narrow or blocked (coronary artery disease), a disease which makes the heart muscle thick, weak, or stiff (cardiomyopathy). ? Diabetes. ? Chronic kidney disease. ? Sickle cell disease, a condition in which red blood cells have an abnormal "sickle" shape. ? Liver disease.  Are obese. What are the signs or symptoms? Symptoms of this condition can range from mild to severe. Symptoms may appear any time from 2 to 14 days after being exposed to the virus. They include:  A fever or chills.  A cough.  Difficulty breathing.  Headaches, body aches, or muscle aches.  Runny or stuffy (congested) nose.  A sore throat.  New loss of taste or smell. Some people may also have stomach problems, such as nausea, vomiting, or diarrhea. Other people may not have any symptoms of COVID-19. How is this diagnosed? This condition may be diagnosed based on:  Your signs and symptoms, especially if: ? You live in an area with a COVID-19 outbreak. ? You recently traveled to or from an area where the virus is common. ? You provide care for or live with a person who was diagnosed with COVID-19. ? You were exposed to a person who was diagnosed with COVID-19.  A physical exam.  Lab tests, which may include: ? Taking a sample of fluid from the back of your nose and throat (nasopharyngeal fluid), your nose, or your throat using a swab. ? A sample of mucus from your lungs (sputum). ? Blood tests.  Imaging tests, which may include, X-rays, CT scan, or ultrasound. How is this treated? At present, there is no medicine to treat COVID-19. Medicines that treat other diseases are being used on a trial basis to see if they are effective against COVID-19. Your  health care provider will talk with you about ways to treat your symptoms. For most people, the infection is mild and can be managed at home with rest, fluids, and over-the-counter medicines. Treatment for a serious infection usually takes places in a hospital intensive care unit (ICU). It may include one or more of the following treatments. These treatments are given until your symptoms improve.  Receiving fluids and medicines through an IV.  Supplemental oxygen. Extra oxygen is given through a tube in the nose, a face mask, or a hood.  Positioning you to lie on your stomach (prone position). This makes it easier for oxygen to get into the lungs.  Continuous positive airway pressure (CPAP) or bi-level positive airway pressure (BPAP) machine. This treatment uses mild air pressure to keep the airways open. A tube that is connected to a motor delivers oxygen to the body.  Ventilator. This treatment moves air into and out of the lungs by using a tube that is placed in your windpipe.  Tracheostomy. This is a procedure to create a hole in the neck so that a breathing tube can be inserted.  Extracorporeal membrane   oxygenation (ECMO). This procedure gives the lungs a chance to recover by taking over the functions of the heart and lungs. It supplies oxygen to the body and removes carbon dioxide. Follow these instructions at home: Lifestyle  If you are sick, stay home except to get medical care. Your health care provider will tell you how long to stay home. Call your health care provider before you go for medical care.  Rest at home as told by your health care provider.  Do not use any products that contain nicotine or tobacco, such as cigarettes, e-cigarettes, and chewing tobacco. If you need help quitting, ask your health care provider.  Return to your normal activities as told by your health care provider. Ask your health care provider what activities are safe for you. General  instructions  Take over-the-counter and prescription medicines only as told by your health care provider.  Drink enough fluid to keep your urine pale yellow.  Keep all follow-up visits as told by your health care provider. This is important. How is this prevented?  There is no vaccine to help prevent COVID-19 infection. However, there are steps you can take to protect yourself and others from this virus. To protect yourself:   Do not travel to areas where COVID-19 is a risk. The areas where COVID-19 is reported change often. To identify high-risk areas and travel restrictions, check the CDC travel website: wwwnc.cdc.gov/travel/notices  If you live in, or must travel to, an area where COVID-19 is a risk, take precautions to avoid infection. ? Stay away from people who are sick. ? Wash your hands often with soap and water for 20 seconds. If soap and water are not available, use an alcohol-based hand sanitizer. ? Avoid touching your mouth, face, eyes, or nose. ? Avoid going out in public, follow guidance from your state and local health authorities. ? If you must go out in public, wear a cloth face covering or face mask. Make sure your mask covers your nose and mouth. ? Avoid crowded indoor spaces. Stay at least 6 feet (2 meters) away from others. ? Disinfect objects and surfaces that are frequently touched every day. This may include:  Counters and tables.  Doorknobs and light switches.  Sinks and faucets.  Electronics, such as phones, remote controls, keyboards, computers, and tablets. To protect others: If you have symptoms of COVID-19, take steps to prevent the virus from spreading to others.  If you think you have a COVID-19 infection, contact your health care provider right away. Tell your health care team that you think you may have a COVID-19 infection.  Stay home. Leave your house only to seek medical care. Do not use public transport.  Do not travel while you are  sick.  Wash your hands often with soap and water for 20 seconds. If soap and water are not available, use alcohol-based hand sanitizer.  Stay away from other members of your household. Let healthy household members care for children and pets, if possible. If you have to care for children or pets, wash your hands often and wear a mask. If possible, stay in your own room, separate from others. Use a different bathroom.  Make sure that all people in your household wash their hands well and often.  Cough or sneeze into a tissue or your sleeve or elbow. Do not cough or sneeze into your hand or into the air.  Wear a cloth face covering or face mask. Make sure your mask covers your nose   and mouth. Where to find more information  Centers for Disease Control and Prevention: www.cdc.gov/coronavirus/2019-ncov/index.html  World Health Organization: www.who.int/health-topics/coronavirus Contact a health care provider if:  You live in or have traveled to an area where COVID-19 is a risk and you have symptoms of the infection.  You have had contact with someone who has COVID-19 and you have symptoms of the infection. Get help right away if:  You have trouble breathing.  You have pain or pressure in your chest.  You have confusion.  You have bluish lips and fingernails.  You have difficulty waking from sleep.  You have symptoms that get worse. These symptoms may represent a serious problem that is an emergency. Do not wait to see if the symptoms will go away. Get medical help right away. Call your local emergency services (911 in the [place] Do not drive yourself to the hospital. Let the emergency medical personnel know if you think you have COVID-19. Summary  COVID-19 is a respiratory infection that is caused by a virus. It is also known as coronavirus disease or novel coronavirus. It can cause serious infections, such as pneumonia, acute respiratory distress syndrome, acute respiratory failure,  or sepsis.  The virus that causes COVID-19 is contagious. This means that it can spread from person to person through droplets from breathing, speaking, singing, coughing, or sneezing.  You are more likely to develop a serious illness if you are 50 years of age or older, have a weak immune system, live in a nursing home, or have chronic disease.  There is no medicine to treat COVID-19. Your health care provider will talk with you about ways to treat your symptoms.  Take steps to protect yourself and others from infection. Wash your hands often and disinfect objects and surfaces that are frequently touched every day. Stay away from people who are sick and wear a mask if you are sick. This information is not intended to replace advice given to you by your health care provider. Make sure you discuss any questions you have with your health care provider. Document Revised: 07/10/2019 Document Reviewed: 10/16/2018 Elsevier Patient Education  2020 Elsevier Inc.  What types of side effects do monoclonal antibody drugs cause?  Common side effects  In general, the more common side effects caused by monoclonal antibody drugs include: . Allergic reactions, such as hives or itching . Flu-like signs and symptoms, including chills, fatigue, fever, and muscle aches and pains . Nausea, vomiting . Diarrhea . Skin rashes . Low blood pressure   The CDC is recommending patients who receive monoclonal antibody treatments wait at least 90 days before being vaccinated.  Currently, there are no data on the safety and efficacy of mRNA COVID-19 vaccines in persons who received monoclonal antibodies or convalescent plasma as part of COVID-19 treatment. Based on the estimated half-life of such therapies as well as evidence suggesting that reinfection is uncommon in the 90 days after initial infection, vaccination should be deferred for at least 90 days, as a precautionary measure until additional information becomes  available, to avoid interference of the antibody treatment with vaccine-induced immune responses.  

## 2020-08-08 NOTE — Progress Notes (Signed)
Diagnosis: COVID-19  Physician: Dr. Patrick Wright  Procedure: [facility] Med: Sotrovimab infusion - Provided patient with sotrovimab fact sheet for patients, parents, and caregivers prior to infusion.   Complications: No immediate complications noted  Discharge: Discharged home    

## 2020-08-27 ENCOUNTER — Emergency Department (HOSPITAL_COMMUNITY)
Admission: EM | Admit: 2020-08-27 | Discharge: 2020-08-27 | Disposition: A | Discharge status: Home / Self Care | Payer: Self-pay | Attending: Emergency Medicine | Admitting: Emergency Medicine

## 2020-08-27 ENCOUNTER — Encounter (HOSPITAL_COMMUNITY): Payer: Self-pay | Admitting: Emergency Medicine

## 2020-08-27 ENCOUNTER — Other Ambulatory Visit: Payer: Self-pay

## 2020-08-27 ENCOUNTER — Emergency Department (HOSPITAL_COMMUNITY): Payer: Self-pay

## 2020-08-27 DIAGNOSIS — R519 Headache, unspecified: Secondary | ICD-10-CM | POA: Insufficient documentation

## 2020-08-27 DIAGNOSIS — J45909 Unspecified asthma, uncomplicated: Secondary | ICD-10-CM | POA: Insufficient documentation

## 2020-08-27 DIAGNOSIS — F17210 Nicotine dependence, cigarettes, uncomplicated: Secondary | ICD-10-CM | POA: Insufficient documentation

## 2020-08-27 HISTORY — DX: Attention-deficit hyperactivity disorder, unspecified type: F90.9

## 2020-08-27 MED ORDER — IBUPROFEN 800 MG PO TABS
800.0000 mg | ORAL_TABLET | Freq: Once | ORAL | Status: AC
  Administered 2020-08-27: 800 mg via ORAL
  Filled 2020-08-27: qty 1

## 2020-08-27 NOTE — ED Provider Notes (Signed)
[facility] EMERGENCY DEPARTMENT Provider Note   CSN: [id number] Arrival date & time: 08/27/20  1146     History Chief Complaint  Patient presents with  . Headache    Rhonda Good is a 29 y.o. female.  The history is provided by the patient. No language interpreter was used.  Headache Pain location:  Frontal Radiates to:  Does not radiate Onset quality:  Gradual Timing:  Constant Progression:  Worsening Chronicity:  New Similar to prior headaches: yes   Relieved by:  Nothing Worsened by:  Nothing Associated symptoms: facial pain    Pt complains of  Lump on her forehead and a raised area mid frontal scalp.   Pt reports she noticed this area 6 months ago.  Pt reports frequent headaches.  Pt request ibuprofen here for current headache  Past Medical History:  Diagnosis Date  . Abnormal Pap smear of cervix   . ADHD   . Asthma   . Bipolar 1 disorder (HCC)   . Chronic back pain   . Drug abuse, IV (HCC)   . Drug overdose   . Ovarian cyst     Patient Active Problem List   Diagnosis Date Noted  . Abnormal Pap smear of cervix 12/18/2013  . Alcohol dependence (HCC) 08/23/2012  . Substance induced mood disorder (HCC) 08/23/2012  . Encephalopathy acute 08/20/2012  . Overdose 08/20/2012  . Hypotension 08/20/2012  . Alcohol intoxication (HCC) 08/20/2012  . Bipolar disorder (HCC) 08/20/2012    Past Surgical History:  Procedure Laterality Date  . HAND SURGERY    . MOUTH SURGERY    . ORIF TIBIA PLATEAU Left 07/25/2013   Procedure: OPEN REDUCTION INTERNAL FIXATION (ORIF) TIBIAL PLATEAU;  Surgeon: Toni Arthurs, MD;  Location: [facility] OR;  Service: Orthopedics;  Laterality: Left;     OB History    Gravida  1   Para      Term      Preterm      AB  1   Living        SAB      TAB      Ectopic      Multiple      Live Births              Family History  Problem Relation Age of Onset  . Diabetes Mother   . Hyperlipidemia Mother   . Cancer Maternal  Grandfather   . Diabetes Paternal Grandmother   . Diabetes Maternal Aunt   . Diabetes Maternal Uncle     Social History   Tobacco Use  . Smoking status: Current Every Day Smoker    Packs/day: 1.00    Years: 10.00    Pack years: 10.00    Types: Cigarettes  . Smokeless tobacco: Never Used  Vaping Use  . Vaping Use: Never used  Substance Use Topics  . Alcohol use: Yes    Comment: occ  . Drug use: Not Currently    Types: IV, Marijuana    Comment: occasionally    Home Medications Prior to Admission medications   Medication Sig Start Date End Date Taking? Authorizing Provider  amphetamine-dextroamphetamine (ADDERALL) 10 MG tablet Take 10 mg by mouth 2 (two) times daily with a meal.   Yes [provider]  acetaminophen (TYLENOL) 500 MG tablet Take 500 mg by mouth every 6 (six) hours as needed for mild pain or moderate pain.    [provider]  hydrOXYzine (ATARAX/VISTARIL) 25 MG tablet Take 1 tablet (  25 mg total) by mouth every 6 (six) hours. 04/01/19   Petrucelli, Samantha R, PA-C  Oxycodone HCl 10 MG TABS Take 10 mg by mouth 4 (four) times daily.    [provider]  potassium chloride SA (K-DUR) 20 MEQ tablet Take 1 tablet (20 mEq total) by mouth daily. 04/01/19   Petrucelli, Samantha R, PA-C  predniSONE (DELTASONE) 20 MG tablet Take 2 tablets (40 mg total) by mouth daily. 06/15/19   Ivery Quale, PA-C    Allergies    Hydrocodone and Naproxen  Review of Systems   Review of Systems  Neurological: Positive for headaches.  All other systems reviewed and are negative.   Physical Exam Updated Vital Signs BP 117/67 (BP Location: Right Arm)   Pulse 74   Temp 98.4 F (36.9 C) (Oral)   Resp 16   Ht 5\' 6"  (1.676 m)   LMP 08/08/2020 (Approximate)   SpO2 97%   BMI 35.51 kg/m   Physical Exam Vitals and nursing note reviewed.  Constitutional:      Appearance: She is well-developed.  HENT:     Head: Normocephalic and atraumatic.  Eyes:      Extraocular Movements: Extraocular movements intact.  Cardiovascular:     Rate and Rhythm: Normal rate.     Heart sounds: Normal heart sounds.  Pulmonary:     Effort: Pulmonary effort is normal.  Abdominal:     General: There is no distension.     Palpations: Abdomen is soft.  Musculoskeletal:        General: Normal range of motion.     Cervical back: Normal range of motion.  Neurological:     Mental Status: She is alert and oriented to person, place, and time.  Psychiatric:        Mood and Affect: Mood normal.     ED Results / Procedures / Treatments   Labs (all labs ordered are listed, but only abnormal results are displayed) Labs Reviewed - No data to display  EKG None  Radiology CT Head Wo Contrast  Result Date: 08/27/2020 CLINICAL DATA:  Headaches EXAM: CT HEAD WITHOUT CONTRAST TECHNIQUE: Contiguous axial images were obtained from the base of the skull through the vertex without intravenous contrast. COMPARISON:  01/28/2013 FINDINGS: Brain: No evidence of acute infarction, hemorrhage, hydrocephalus, extra-axial collection or mass lesion/mass effect. Vascular: No hyperdense vessel or unexpected calcification. Skull: Normal. Negative for fracture or focal lesion. Sinuses/Orbits: No acute finding. Other: None. IMPRESSION: No acute intracranial abnormality noted. Electronically Signed   By: 03/30/2013 M.D.   On: 08/27/2020 13:20    Procedures Procedures (including critical care time)  Medications Ordered in ED Medications  ibuprofen (ADVIL) tablet 800 mg (800 mg Oral Given 08/27/20 1304)    ED Course  I have reviewed the triage vital signs and the nursing notes.  Pertinent labs & imaging results that were available during my care of the patient were reviewed by me and considered in my medical decision making (see chart for details).    MDM Rules/Calculators/A&P                          MDM:  Pt's scalp feels normal. No mass,  Ct head is normal.  Pt advised  ibuprofen and follow up with her MD for recheck.  Final Clinical Impression(s) / ED Diagnoses Final diagnoses:  Bad headache    Rx / DC Orders ED Discharge Orders    None  An After Visit Summary was printed and given to the patient.   Elson Areas, [place] 08/27/20 1412    Eber Hong, MD 08/30/20 [phone]

## 2020-08-27 NOTE — ED Notes (Signed)
Here for eval of "bump" to her head   that is painful to the touch  X 6 months  No previous eval

## 2020-08-27 NOTE — ED Notes (Signed)
To CT

## 2020-08-27 NOTE — Discharge Instructions (Signed)
Return if any problems. Your head scan is normal.  Scan does not show any scalp growths.

## 2020-08-27 NOTE — ED Notes (Signed)
Upon discharge, pt reports taking 2 oxycontins prior to coming here

## 2020-08-27 NOTE — ED Notes (Signed)
KS,PA in to assess 

## 2020-08-27 NOTE — ED Triage Notes (Signed)
Pt/ co headaches for the past 6 months. Pt found a bump on her head a month and a half ago that hurts to the touch. Pt has not been seen by her PCP.

## 2020-08-27 NOTE — ED Notes (Signed)
Non photophobic   NAD   Followed by Dr Delbert Harness

## 2022-07-30 ENCOUNTER — Encounter (HOSPITAL_COMMUNITY): Payer: Self-pay | Admitting: Emergency Medicine

## 2022-07-30 ENCOUNTER — Emergency Department (HOSPITAL_COMMUNITY): Payer: Self-pay

## 2022-07-30 ENCOUNTER — Other Ambulatory Visit: Payer: Self-pay

## 2022-07-30 ENCOUNTER — Emergency Department (HOSPITAL_COMMUNITY)
Admission: EM | Admit: 2022-07-30 | Discharge: 2022-07-30 | Disposition: A | Discharge status: Home / Self Care | Payer: Self-pay | Attending: Emergency Medicine | Admitting: Emergency Medicine

## 2022-07-30 DIAGNOSIS — R519 Headache, unspecified: Secondary | ICD-10-CM | POA: Insufficient documentation

## 2022-07-30 DIAGNOSIS — R7309 Other abnormal glucose: Secondary | ICD-10-CM | POA: Insufficient documentation

## 2022-07-30 LAB — URINALYSIS, ROUTINE W REFLEX MICROSCOPIC
Bacteria, UA: NONE SEEN
Bilirubin Urine: NEGATIVE
Glucose, UA: NEGATIVE mg/dL
Ketones, ur: NEGATIVE mg/dL
Leukocytes,Ua: NEGATIVE
Nitrite: NEGATIVE
Protein, ur: NEGATIVE mg/dL
Specific Gravity, Urine: 1.014 (ref 1.005–1.030)
pH: 5 (ref 5.0–8.0)

## 2022-07-30 LAB — BASIC METABOLIC PANEL
Anion gap: 9 (ref 5–15)
BUN: 13 mg/dL (ref 6–20)
CO2: 20 mmol/L — ABNORMAL LOW (ref 22–32)
Calcium: 9.2 mg/dL (ref 8.9–10.3)
Chloride: 106 mmol/L (ref 98–111)
Creatinine, Ser: 0.63 mg/dL (ref 0.44–1.00)
GFR, Estimated: >60 mL/min (ref >60)
Glucose, Bld: 84 mg/dL (ref 70–99)
Potassium: 3.6 mmol/L (ref 3.5–5.1)
Sodium: 135 mmol/L (ref 135–145)

## 2022-07-30 LAB — CBC
HCT: 44.7 % (ref 36.0–46.0)
Hemoglobin: 14.9 g/dL (ref 12.0–15.0)
MCH: 31.4 pg (ref 26.0–34.0)
MCHC: 33.3 g/dL (ref 30.0–36.0)
MCV: 94.3 fL (ref 80.0–100.0)
Platelets: 206 10*3/uL (ref 150–400)
RBC: 4.74 MIL/uL (ref 3.87–5.11)
RDW: 12.9 % (ref 11.5–15.5)
WBC: 10.8 10*3/uL — ABNORMAL HIGH (ref 4.0–10.5)
nRBC: 0 % (ref 0.0–0.2)

## 2022-07-30 LAB — POC URINE PREG, ED: Preg Test, Ur: NEGATIVE

## 2022-07-30 LAB — CBG MONITORING, ED: Glucose-Capillary: 145 mg/dL — ABNORMAL HIGH (ref 70–99)

## 2022-07-30 MED ORDER — MORPHINE SULFATE (PF) 4 MG/ML IV SOLN
4.0000 mg | Freq: Once | INTRAVENOUS | Status: AC
  Administered 2022-07-30: 4 mg via INTRAVENOUS
  Filled 2022-07-30: qty 1

## 2022-07-30 MED ORDER — PROCHLORPERAZINE EDISYLATE 10 MG/2ML IJ SOLN
10.0000 mg | Freq: Once | INTRAMUSCULAR | Status: AC
  Administered 2022-07-30: 10 mg via INTRAVENOUS
  Filled 2022-07-30: qty 2

## 2022-07-30 MED ORDER — DIPHENHYDRAMINE HCL 50 MG/ML IJ SOLN
12.5000 mg | Freq: Once | INTRAMUSCULAR | Status: AC
  Administered 2022-07-30: 12.5 mg via INTRAVENOUS
  Filled 2022-07-30: qty 1

## 2022-07-30 NOTE — ED Notes (Signed)
Went over dc papers. All questions answered. Ambulatory to lobby with family.  

## 2022-07-30 NOTE — ED Notes (Signed)
Pt back from CT

## 2022-07-30 NOTE — ED Triage Notes (Signed)
Pt reports headache, "mixing my words up," and unsteady gait x 2 weeks. Pt walked with steady gait to triage room.

## 2022-07-30 NOTE — Discharge Instructions (Signed)
Your head CT looks normal.  Please follow-up with your primary care provider.  I am glad you are feeling a [place] bit better.  Return with any worsening symptoms, it was a pleasure to meet you and we hope you feel better!

## 2022-07-30 NOTE — ED Provider Notes (Signed)
[facility] EMERGENCY DEPARTMENT Provider Note   CSN: [id number] Arrival date & time: 07/30/22  1547     History  Chief Complaint  Patient presents with   Headache    Rhonda Good is a 31 y.o. female with a past medical history of substance use disorder presenting today due to headache has been intermittent for "a while."  Also says for the past 2 weeks she has been jumbling her speech and having trouble finding her words.  This is intermittent.  No numbness or tingling.  Does states she has felt off balance.  No blurred vision.  No known head trauma.   Headache Associated symptoms: no photophobia and no weakness        Home Medications Prior to Admission medications   Medication Sig Start Date End Date Taking? Authorizing Provider  acetaminophen (TYLENOL) 500 MG tablet Take 500 mg by mouth every 6 (six) hours as needed for mild pain or moderate pain.    [provider]  amphetamine-dextroamphetamine (ADDERALL) 10 MG tablet Take 10 mg by mouth 2 (two) times daily with a meal.    [provider]  hydrOXYzine (ATARAX/VISTARIL) 25 MG tablet Take 1 tablet (25 mg total) by mouth every 6 (six) hours. 04/01/19   Petrucelli, Samantha R, PA-C  Oxycodone HCl 10 MG TABS Take 10 mg by mouth 4 (four) times daily.    [provider]  potassium chloride SA (K-DUR) 20 MEQ tablet Take 1 tablet (20 mEq total) by mouth daily. 04/01/19   Petrucelli, Samantha R, PA-C  predniSONE (DELTASONE) 20 MG tablet Take 2 tablets (40 mg total) by mouth daily. 06/15/19   Lily Kocher, PA-C      Allergies    Hydrocodone and Naproxen    Review of Systems   Review of Systems  Eyes:  Negative for photophobia and visual disturbance.  Neurological:  Positive for speech difficulty and headaches. Negative for weakness.  Psychiatric/Behavioral:  Negative for confusion.     Physical Exam Updated Vital Signs BP 127/81 (BP Location: Left Arm)   Pulse 73   Temp 98.1 F (36.7 C) (Oral)    Resp 18   SpO2 93%  Physical Exam Vitals and nursing note reviewed.  Constitutional:      Appearance: Normal appearance.  HENT:     Head: Normocephalic and atraumatic.  Eyes:     General: No visual field deficit or scleral icterus.    Conjunctiva/sclera: Conjunctivae normal.  Pulmonary:     Effort: Pulmonary effort is normal. No respiratory distress.  Musculoskeletal:     Comments: 5 out of 5 strength in bilateral upper and lower extremities  Skin:    Findings: No rash.  Neurological:     Mental Status: She is alert and oriented to person, place, and time.     GCS: GCS eye subscore is 4. GCS verbal subscore is 5. GCS motor subscore is 6.     Cranial Nerves: No cranial nerve deficit or dysarthria.  Psychiatric:        Mood and Affect: Mood normal.        Behavior: Behavior normal.     ED Results / Procedures / Treatments   Labs (all labs ordered are listed, but only abnormal results are displayed) Labs Reviewed  BASIC METABOLIC PANEL - Abnormal; Notable for the following components:      Result Value   CO2 20 (*)    All other components within normal limits  CBC - Abnormal; Notable for the  following components:   WBC 10.8 (*)    All other components within normal limits  URINALYSIS, ROUTINE W REFLEX MICROSCOPIC - Abnormal; Notable for the following components:   Hgb urine dipstick SMALL (*)    All other components within normal limits  CBG MONITORING, ED - Abnormal; Notable for the following components:   Glucose-Capillary 145 (*)    All other components within normal limits  POC URINE PREG, ED    EKG None  Radiology CT Head Wo Contrast  Result Date: 07/30/2022 CLINICAL DATA:  Headache, speech difficulties, unsteady gait x2 weeks EXAM: CT HEAD WITHOUT CONTRAST TECHNIQUE: Contiguous axial images were obtained from the base of the skull through the vertex without intravenous contrast. RADIATION DOSE REDUCTION: This exam was performed according to the departmental  dose-optimization program which includes automated exposure control, adjustment of the mA and/or kV according to patient size and/or use of iterative reconstruction technique. COMPARISON:  None Available. FINDINGS: Brain: No evidence of acute infarction, hemorrhage, hydrocephalus, extra-axial collection or mass lesion/mass effect. Vascular: No hyperdense vessel or unexpected calcification. Skull: Normal. Negative for fracture or focal lesion. Sinuses/Orbits: The visualized paranasal sinuses are essentially clear. The mastoid air cells are unopacified. Other: None. IMPRESSION: Normal head CT. Electronically Signed   By: Julian Hy M.D.   On: 07/30/2022 19:15    Procedures Procedures   Medications Ordered in ED Medications  morphine (PF) 4 MG/ML injection 4 mg (has no administration in time range)  prochlorperazine (COMPAZINE) injection 10 mg (has no administration in time range)  diphenhydrAMINE (BENADRYL) injection 12.5 mg (has no administration in time range)    ED Course/ Medical Decision Making/ A&P                           Medical Decision Making Amount and/or Complexity of Data Reviewed Labs: ordered. Radiology: ordered.  Risk Prescription drug management.   This is a 31 year old female who presents to the ED for concern of headache. Emergent considerations for headache include subarachnoid hemorrhage, meningitis, temporal arteritis, glaucoma, cerebral ischemia, carotid/vertebral dissection, intracranial tumor, Venous sinus thrombosis, carbon monoxide poisoning, acute or chronic subdural hemorrhage.   This is not an exhaustive differential.    Past Medical History / Co-morbidities / Social History: Remote substance use disorderAdditional history: Per chart review patient had a similar presentation in 2021.  Negative head CT at that time.   Physical Exam: Pertinent physical exam findings include Unremarkable  Lab Tests: I ordered, and personally interpreted labs.  The  pertinent results include: Benign   Imaging Studies: I ordered and independently visualized and interpreted head CT and I agree with the radiologist that there are no acute findings     Medications: I ordered medication including Compazine, Benadryl and morphine.  Toradol avoided due to patient's hives, itching and swelling allergy.  On reevaluation patient reports feeling a Dampier bit better.  MDM/Disposition: This is a 31 year old female presenting today with a headache.  Said that she also felt somewhat off balance and maybe had jumbled speech over the past couple of weeks.  Neurologic exam completely benign.  Very low suspicion for CVA and do not believe an MRI is indicated at this time.  She was treated with migraine medications and was reevaluated.  She is requesting discharge at this time.  She seems upset about the amount of time she was stuck in order to get an IV.  She does not have any signs of imminent condition needing  further emergency room evaluation.  She will be discharged per her request.  Final Clinical Impression(s) / ED Diagnoses Final diagnoses:  Bad headache    Rx / DC Orders ED Discharge Orders     None      Results and diagnoses were explained to the patient. Return precautions discussed in full. Patient had no additional questions and expressed complete understanding.   This chart was dictated using voice recognition software.  Despite best efforts to proofread,  errors can occur which can change the documentation meaning.    Darliss Ridgel 07/30/22 2040    Godfrey Pick, MD 07/31/22 1540

## 2023-06-20 ENCOUNTER — Ambulatory Visit: Payer: Medicaid Other | Admitting: Obstetrics & Gynecology

## 2023-07-30 ENCOUNTER — Ambulatory Visit (INDEPENDENT_AMBULATORY_CARE_PROVIDER_SITE_OTHER): Payer: Medicaid Other | Admitting: Obstetrics & Gynecology

## 2023-07-30 ENCOUNTER — Encounter: Payer: Self-pay | Admitting: Obstetrics & Gynecology

## 2023-07-30 ENCOUNTER — Other Ambulatory Visit (HOSPITAL_COMMUNITY)
Admission: RE | Admit: 2023-07-30 | Discharge: 2023-07-30 | Disposition: A | Discharge status: Home / Self Care | Payer: Medicaid Other | Source: Ambulatory Visit | Attending: Obstetrics & Gynecology | Admitting: Obstetrics & Gynecology

## 2023-07-30 VITALS — BP 107/79 | HR 85 | Ht 66.0 in | Wt 190.0 lb

## 2023-07-30 DIAGNOSIS — Z01419 Encounter for gynecological examination (general) (routine) without abnormal findings: Secondary | ICD-10-CM

## 2023-07-30 NOTE — Addendum Note (Signed)
Addended by: Moss Mc on: 07/30/2023 04:28 PM   Modules accepted: Orders

## 2023-07-30 NOTE — Progress Notes (Signed)
Subjective:     Rhonda Good is a 32 y.o. female here for a routine exam.  Patient's last menstrual period was 07/20/2023. G1P0010 Birth Control Method:  none Menstrual Calendar(currently): regular monthly  Current complaints: wants to get pregnant.   Current acute medical issues:  none   Recent Gynecologic History Patient's last menstrual period was 07/20/2023. Last Pap: unsure,   Last mammogram: na,    Past Medical History:  Diagnosis Date   Abnormal Pap smear of cervix    ADHD    Asthma    Bipolar 1 disorder (HCC)    Chronic back pain    Drug abuse, IV (HCC)    Drug overdose    Ovarian cyst    Thyroid nodule     Past Surgical History:  Procedure Laterality Date   HAND SURGERY     MOUTH SURGERY     ORIF TIBIA PLATEAU Left 07/25/2013   Procedure: OPEN REDUCTION INTERNAL FIXATION (ORIF) TIBIAL PLATEAU;  Surgeon: Toni Arthurs, MD;  Location: [facility] OR;  Service: Orthopedics;  Laterality: Left;    OB History     Gravida  1   Para      Term      Preterm      AB  1   Living         SAB      IAB      Ectopic      Multiple      Live Births              Social History   Socioeconomic History   Marital status: Single    Spouse name: Not on file   Number of children: Not on file   Years of education: Not on file   Highest education level: Not on file  Occupational History   Not on file  Tobacco Use   Smoking status: Former    Current packs/day: 1.00    Average packs/day: 1 pack/day for 10.0 years (10.0 ttl pk-yrs)    Types: Cigarettes   Smokeless tobacco: Never  Vaping Use   Vaping status: Every Day  Substance and Sexual Activity   Alcohol use: Yes    Comment: occ   Drug use: Not Currently    Types: IV, Marijuana    Comment: occasionally   Sexual activity: Yes    Birth control/protection: None  Other Topics Concern   Not on file  Social History Narrative   Not on file   Social Determinants of Health   Financial Resource Strain:  Medium Risk (07/30/2023)   Overall Financial Resource Strain (CARDIA)    Difficulty of Paying Living Expenses: Somewhat hard  Food Insecurity: Food Insecurity Present (07/30/2023)   Hunger Vital Sign    Worried About Running Out of Food in the Last Year: Sometimes true    Ran Out of Food in the Last Year: Sometimes true  Transportation Needs: No Transportation Needs (07/30/2023)   PRAPARE - Administrator, Civil Service (Medical): No    Lack of Transportation (Non-Medical): No  Physical Activity: Sufficiently Active (07/30/2023)   Exercise Vital Sign    Days of Exercise per Week: 5 days    Minutes of Exercise per Session: 60 min  Stress: Stress Concern Present (07/30/2023)   [employer] of Occupational Health - Occupational Stress Questionnaire    Feeling of Stress : To some extent  Social Connections: Moderately Integrated (07/30/2023)   Social Connection and Isolation Panel [NHANES]  Frequency of Communication with Friends and Family: More than three times a week    Frequency of Social Gatherings with Friends and Family: Once a week    Attends Religious Services: 1 to 4 times per year    Active Member of [employer] or Organizations: No    Attends Engineer, structural: Never    Marital Status: Married    Family History  Problem Relation Age of Onset   Diabetes Paternal Grandmother    Cancer Maternal Grandfather    Diabetes Mother    Hyperlipidemia Mother    Diabetes Maternal Aunt    Cancer Maternal Uncle    Diabetes Maternal Uncle      Current Outpatient Medications:    buprenorphine (SUBUTEX) 8 MG SUBL SL tablet, DISSOLVE 1/2 TABLET UNDER THE TONGUE TWICE DAILY, Disp: , Rfl:    diazepam (VALIUM) 5 MG tablet, Take 5 mg by mouth 3 (three) times daily as needed., Disp: , Rfl:    gabapentin (NEURONTIN) 100 MG capsule, , Disp: , Rfl:    Multiple Vitamin (MULTIVITAMIN) tablet, Take 1 tablet by mouth daily., Disp: , Rfl:    oxyCODONE-acetaminophen (PERCOCET)  10-325 MG tablet, Take 1 tablet by mouth., Disp: , Rfl:    potassium chloride SA (K-DUR) 20 MEQ tablet, Take 1 tablet (20 mEq total) by mouth daily., Disp: 5 tablet, Rfl: 0   zolpidem (AMBIEN) 5 MG tablet, Take by mouth., Disp: , Rfl:    acetaminophen (TYLENOL) 500 MG tablet, Take 500 mg by mouth every 6 (six) hours as needed for mild pain or moderate pain. (Patient not taking: Reported on 07/30/2023), Disp: , Rfl:    naloxone (NARCAN) nasal spray 4 mg/0.1 mL, SMARTSIG:Both Nares (Patient not taking: Reported on 07/30/2023), Disp: , Rfl:   Review of Systems  Review of Systems  Constitutional: Negative for fever, chills, weight loss, malaise/fatigue and diaphoresis.  HENT: Negative for hearing loss, ear pain, nosebleeds, congestion, sore throat, neck pain, tinnitus and ear discharge.   Eyes: Negative for blurred vision, double vision, photophobia, pain, discharge and redness.  Respiratory: Negative for cough, hemoptysis, sputum production, shortness of breath, wheezing and stridor.   Cardiovascular: Negative for chest pain, palpitations, orthopnea, claudication, leg swelling and PND.  Gastrointestinal: negative for abdominal pain. Negative for heartburn, nausea, vomiting, diarrhea, constipation, blood in stool and melena.  Genitourinary: Negative for dysuria, urgency, frequency, hematuria and flank pain.  Musculoskeletal: Negative for myalgias, back pain, joint pain and falls.  Skin: Negative for itching and rash.  Neurological: Negative for dizziness, tingling, tremors, sensory change, speech change, focal weakness, seizures, loss of consciousness, weakness and headaches.  Endo/Heme/Allergies: Negative for environmental allergies and polydipsia. Does not bruise/bleed easily.  Psychiatric/Behavioral: Negative for depression, suicidal ideas, hallucinations, memory loss and substance abuse. The patient is not nervous/anxious and does not have insomnia.        Objective:  Blood pressure 107/79,  pulse 85, height 5\' 6"  (1.676 m), weight 190 lb (86.2 kg), last menstrual period 07/20/2023.   Physical Exam  Vitals reviewed. Constitutional: She is oriented to person, place, and time. She appears well-developed and well-nourished.  HENT:  Head: Normocephalic and atraumatic.        Right Ear: External ear normal.  Left Ear: External ear normal.  Nose: Nose normal.  Mouth/Throat: Oropharynx is clear and moist.  Eyes: Conjunctivae and EOM are normal. Pupils are equal, round, and reactive to light. Right eye exhibits no discharge. Left eye exhibits no discharge. No scleral icterus.  Neck:  Normal range of motion. Neck supple. No tracheal deviation present. No thyromegaly present.  Cardiovascular: Normal rate, regular rhythm, normal heart sounds and intact distal pulses.  Exam reveals no gallop and no friction rub.   No murmur heard. Respiratory: Effort normal and breath sounds normal. No respiratory distress. She has no wheezes. She has no rales. She exhibits no tenderness.  GI: Soft. Bowel sounds are normal. She exhibits no distension and no mass. There is no tenderness. There is no rebound and no guarding.  Genitourinary:  Breasts no masses skin changes or nipple changes bilaterally      Vulva is normal without lesions Vagina is pink moist without discharge Cervix normal in appearance and pap is done Uterus is normal size shape and contour Adnexa is negative with normal sized ovaries   Musculoskeletal: Normal range of motion. She exhibits no edema and no tenderness.  Neurological: She is alert and oriented to person, place, and time. She has normal reflexes. She displays normal reflexes. No cranial nerve deficit. She exhibits normal muscle tone. Coordination normal.  Skin: Skin is warm and dry. No rash noted. No erythema. No pallor.  Psychiatric: She has a normal mood and affect. Her behavior is normal. Judgment and thought content normal.       Medications Ordered at today's  visit: No orders of the defined types were placed in this encounter.   Other orders placed at today's visit: No orders of the defined types were placed in this encounter.     Assessment:    Normal Gyn exam.    Plan:    Contraception: none. Follow up in: 1 year.     No follow-ups on file.

## 2023-08-01 ENCOUNTER — Encounter: Payer: Self-pay | Admitting: *Deleted

## 2023-08-02 LAB — CYTOLOGY - PAP
Comment: NEGATIVE
Diagnosis: NEGATIVE
High risk HPV: NEGATIVE

## 2024-04-10 ENCOUNTER — Encounter: Payer: Self-pay | Admitting: Advanced Practice Midwife

## 2024-09-21 ENCOUNTER — Ambulatory Visit: Admitting: Physical Medicine and Rehabilitation

## 2024-10-07 ENCOUNTER — Ambulatory Visit: Admitting: Physical Medicine and Rehabilitation

## 2024-10-07 ENCOUNTER — Encounter: Payer: Self-pay | Admitting: Physical Medicine and Rehabilitation

## 2024-10-07 DIAGNOSIS — M7918 Myalgia, other site: Secondary | ICD-10-CM

## 2024-10-07 DIAGNOSIS — M542 Cervicalgia: Secondary | ICD-10-CM | POA: Diagnosis not present

## 2024-10-07 NOTE — Progress Notes (Signed)
 "  Rhonda Good - 34 y.o. female MRN [id number]  Date of birth: 12-Dec-1990  Office Visit Note: Visit Date: 10/07/2024 PCP: Rennis Ade, MD (Inactive) Referred by: Jerome Heron Ruth, PA-C  Subjective: Chief Complaint  Patient presents with   Neck - Pain    Neck pain for 1 year. Fell at [employer] and landed on her back. Uses ice pack which helps her pain go down. Sometimes gets a [place] burning. States she has a knot in her neck. Takes Oxycodone  and Gabapentin  for pain. Goes to pain clinic at [facility]. No previous injections or surgeries.   HPI: Rhonda Good is a 34 y.o. female who comes in today per the request of Heron Jerome, [place] for evaluation of chronic, worsening and severe bilateral neck pain. She has diffuse pain to entire spine. Neck pain ongoing for over a year after mechanical fall at [facility]. No specific aggravating factors. She describes as sore and tight sensation, currently rates as 7 out of 10. Some relief of pain with home exercise regimen, rest and use of medications. Patient is currently managed by Dr. Lamar Saba at [facility] from chronic pain standpoint. She is prescribed Valium, Gabapentin , Percocet and Buprenorphine. Cervical radiographs show well preserved disc spacing, no spondylolisthesis, no significant foraminal narrowing. No prior cervical MRI imaging. No history of cervical surgery/injections. Patient working full time as care aid. She denies focal weakness, numbness and tingling. No recent trauma or falls.   Patients course is complicated by bipolar disorder, alcohol  dependence, substance induced mood disorder and overdose.      Review of Systems  Musculoskeletal:  Positive for myalgias and neck pain.  Neurological:  Negative for tingling, sensory change, focal weakness and weakness.  All other systems reviewed and are negative.  Otherwise per HPI.  Assessment & Plan: Visit Diagnoses:    ICD-10-CM   1. Cervicalgia  M54.2  Ambulatory referral to Physical Therapy    2. Myofascial pain syndrome  M79.18 Ambulatory referral to Physical Therapy       Plan: Findings:  Chronic, worsening and severe bilateral neck pain in the setting of chronic pain syndrome. No radicular pain down the arms. Patient continues to have pain despite good conservative therapies such as home exercise regimen, rest and use of medications. Patients clinical presentation and exam are complex. I do think her symptoms today are more myofascial related. There is myofascial tenderness to bilateral cervical paraspinal and trapezius regions upon palpation today. We discussed treatment plan in detail today. Next step is to place order for short course of formal physical therapy/dry needling. I would like to see her back for follow up in approximately 8 weeks. Should her pain persist would consider obtaining cervical MRI imaging. She can continue chronic pain management with Dr. Saba. She has no questions at this time. Her exam today is non focal, good strength noted to bilateral upper extremities.     Meds & Orders: No orders of the defined types were placed in this encounter.   Orders Placed This Encounter  Procedures   Ambulatory referral to Physical Therapy    Follow-up: Return for 8 week follow up post PT.   Procedures: No procedures performed      Clinical History: XR CERVICAL SPINE  Rhonda Good  Final, Reviewed Diagnosis: NECK PAIN (M54.2) Ordered by Doak Bevely Bean on behalf of Lamar HERO. Foster on 1 Performed on 06/30/2024 4:37 PM Reviewed by Lamar HERO. Foster on 07/01/2024  Procedure Note: See Note  (  4 views are submitted. Comparison June 03, 2023.  Cervical vertebral bodies remain intact. Minimal disc space narrowing C4- Remaining disc spaces are intact. Posterior elements are intact. Exit foramina are widely patent. No significant facet arthropathy.  Impression: Minimal disc space narrowing C4-5. Report  electronically signed by Malvina Lunger, on Oct-03-2024 at 04:37 PM   She reports that she has quit smoking. Her smoking use included cigarettes. She has a 10 pack-year smoking history. She has never used smokeless tobacco. No results for input(s): HGBA1C, LABURIC in the last 8760 hours.  Objective:  VS:  HT:    WT:   BMI:     BP:   HR: bpm  TEMP: ( )  RESP:  Physical Exam Vitals and nursing note reviewed.  HENT:     Head: Normocephalic and atraumatic.     Right Ear: External ear normal.     Left Ear: External ear normal.     Nose: Nose normal.     Mouth/Throat:     Mouth: Mucous membranes are moist.  Eyes:     Extraocular Movements: Extraocular movements intact.  Cardiovascular:     Rate and Rhythm: Normal rate.     Pulses: Normal pulses.  Pulmonary:     Effort: Pulmonary effort is normal.  Abdominal:     General: Abdomen is flat. There is no distension.  Musculoskeletal:        General: Tenderness present.     Cervical back: Tenderness present.     Comments: No discomfort noted with flexion, extension and side-to-side rotation. Patient has good strength in the upper extremities including 5 out of 5 strength in wrist extension, long finger flexion and APB. Shoulder range of motion is full bilaterally without any sign of impingement. There is no atrophy of the hands intrinsically. Sensation intact bilaterally. Myofascial tenderness noted upon palpation of bilateral cervical paraspinal and bilateral trapezius regions. Negative Hoffman's sign. Negative Spurling's sign.     Skin:    General: Skin is warm and dry.     Capillary Refill: Capillary refill takes less than 2 seconds.  Neurological:     General: No focal deficit present.     Mental Status: She is alert and oriented to person, place, and time.  Psychiatric:        Mood and Affect: Mood normal.        Behavior: Behavior normal.     Ortho Exam  Imaging: No results found.  Past Medical/Family/Surgical/Social  History: Medications & Allergies reviewed per EMR, new medications updated. Patient Active Problem List   Diagnosis Date Noted   Abnormal Pap smear of cervix 12/18/2013   Alcohol  dependence (HCC) 08/23/2012   Substance induced mood disorder (HCC) 08/23/2012   Encephalopathy acute 08/20/2012   Overdose 08/20/2012   Hypotension 08/20/2012   Alcohol  intoxication 08/20/2012   Bipolar disorder (HCC) 08/20/2012   Past Medical History:  Diagnosis Date   Abnormal Pap smear of cervix    ADHD    Asthma    Bipolar 1 disorder (HCC)    Chronic back pain    Drug abuse, IV (HCC)    Drug overdose    Ovarian cyst    Thyroid nodule    Family History  Problem Relation Age of Onset   Diabetes Paternal Grandmother    Cancer Maternal Grandfather    Diabetes Mother    Hyperlipidemia Mother    Diabetes Maternal Aunt    Cancer Maternal Uncle    Diabetes Maternal Uncle  Past Surgical History:  Procedure Laterality Date   HAND SURGERY     MOUTH SURGERY     ORIF TIBIA PLATEAU Left 07/25/2013   Procedure: OPEN REDUCTION INTERNAL FIXATION (ORIF) TIBIAL PLATEAU;  Surgeon: Norleen Armor, MD;  Location: [facility] OR;  Service: Orthopedics;  Laterality: Left;   Social History   Occupational History   Not on file  Tobacco Use   Smoking status: Former    Current packs/day: 1.00    Average packs/day: 1 pack/day for 10.0 years (10.0 ttl pk-yrs)    Types: Cigarettes   Smokeless tobacco: Never  Vaping Use   Vaping status: Every Day  Substance and Sexual Activity   Alcohol  use: Yes    Comment: occ   Drug use: Not Currently    Types: IV, Marijuana    Comment: occasionally   Sexual activity: Yes    Birth control/protection: None   "

## 2024-11-06 ENCOUNTER — Ambulatory Visit (HOSPITAL_COMMUNITY)

## 2024-12-11 ENCOUNTER — Ambulatory Visit: Admitting: Physical Medicine and Rehabilitation
